# Patient Record
Sex: Male | Born: 1980 | Race: White | Hispanic: No | Marital: Married | Smoking: Current every day smoker
Health system: Southern US, Community
[De-identification: ages and names within clinical notes are randomized; demographics above are authoritative.]

---

## 2009-02-03 ENCOUNTER — Emergency Department (HOSPITAL_COMMUNITY): Admission: EM | Admit: 2009-02-03 | Discharge: 2009-02-03 | Payer: Self-pay | Admitting: Emergency Medicine

## 2009-03-23 ENCOUNTER — Emergency Department (HOSPITAL_COMMUNITY): Admission: EM | Admit: 2009-03-23 | Discharge: 2009-03-23 | Payer: Self-pay | Admitting: Emergency Medicine

## 2009-09-08 ENCOUNTER — Emergency Department (HOSPITAL_COMMUNITY): Admission: EM | Admit: 2009-09-08 | Discharge: 2009-09-08 | Payer: Self-pay | Admitting: Emergency Medicine

## 2009-09-11 ENCOUNTER — Emergency Department (HOSPITAL_COMMUNITY): Admission: EM | Admit: 2009-09-11 | Discharge: 2009-09-11 | Payer: Self-pay | Admitting: Emergency Medicine

## 2009-10-28 ENCOUNTER — Emergency Department (HOSPITAL_COMMUNITY): Admission: EM | Admit: 2009-10-28 | Discharge: 2009-10-28 | Payer: Self-pay | Admitting: Emergency Medicine

## 2009-11-19 ENCOUNTER — Emergency Department (HOSPITAL_COMMUNITY): Admission: EM | Admit: 2009-11-19 | Discharge: 2009-11-19 | Payer: Self-pay | Admitting: Emergency Medicine

## 2010-06-14 LAB — PROTIME-INR: INR: 1.06 (ref 0.00–1.49)

## 2010-06-14 LAB — RAPID URINE DRUG SCREEN, HOSP PERFORMED
Amphetamines: NOT DETECTED
Barbiturates: NOT DETECTED
Cocaine: POSITIVE — AB

## 2010-06-14 LAB — HEPATIC FUNCTION PANEL
Alkaline Phosphatase: 56 U/L (ref 39–117)
Total Bilirubin: 1.1 mg/dL (ref 0.3–1.2)
Total Protein: 7.4 g/dL (ref 6.0–8.3)

## 2010-06-14 LAB — POCT I-STAT, CHEM 8
Chloride: 102 mEq/L (ref 96–112)
Creatinine, Ser: 0.9 mg/dL (ref 0.4–1.5)
Glucose, Bld: 88 mg/dL (ref 70–99)

## 2010-06-14 LAB — CBC
HCT: 41.9 % (ref 39.0–52.0)
MCH: 31.1 pg (ref 26.0–34.0)
MCV: 85.7 fL (ref 78.0–100.0)
RDW: 12.4 % (ref 11.5–15.5)

## 2010-06-14 LAB — DIFFERENTIAL
Basophils Absolute: 0 10*3/uL (ref 0.0–0.1)
Basophils Relative: 0 % (ref 0–1)
Eosinophils Relative: 1 % (ref 0–5)
Lymphs Abs: 2.7 10*3/uL (ref 0.7–4.0)
Neutro Abs: 5.9 10*3/uL (ref 1.7–7.7)
Neutrophils Relative %: 62 % (ref 43–77)

## 2010-06-14 LAB — POCT CARDIAC MARKERS
CKMB, poc: <1 ng/mL — ABNORMAL LOW (ref 1.0–8.0)
CKMB, poc: <1 ng/mL — ABNORMAL LOW (ref 1.0–8.0)
Myoglobin, poc: 72.3 ng/mL (ref 12–200)
Myoglobin, poc: 88.3 ng/mL (ref 12–200)
Troponin i, poc: <0.05 ng/mL (ref 0.00–0.09)

## 2010-06-14 LAB — ETHANOL: Alcohol, Ethyl (B): <5 mg/dL (ref 0–10)

## 2010-06-17 LAB — BASIC METABOLIC PANEL
CO2: 26 mEq/L (ref 19–32)
Calcium: 9.5 mg/dL (ref 8.4–10.5)
Chloride: 102 mEq/L (ref 96–112)
Creatinine, Ser: 1.04 mg/dL (ref 0.4–1.5)
GFR calc Af Amer: >60 mL/min (ref >60)
GFR calc non Af Amer: >60 mL/min (ref >60)

## 2010-06-17 LAB — RAPID URINE DRUG SCREEN, HOSP PERFORMED
Amphetamines: NOT DETECTED
Cocaine: POSITIVE — AB
Tetrahydrocannabinol: NOT DETECTED

## 2010-06-17 LAB — DIFFERENTIAL
Basophils Absolute: 0 10*3/uL (ref 0.0–0.1)
Basophils Relative: 0 % (ref 0–1)
Monocytes Relative: 9 % (ref 3–12)
Neutro Abs: 6.2 10*3/uL (ref 1.7–7.7)
Neutrophils Relative %: 75 % (ref 43–77)

## 2010-06-17 LAB — CBC
MCV: 89.6 fL (ref 78.0–100.0)
Platelets: 189 10*3/uL (ref 150–400)
RBC: 4.79 MIL/uL (ref 4.22–5.81)
RDW: 12.8 % (ref 11.5–15.5)

## 2010-06-17 LAB — ETHANOL: Alcohol, Ethyl (B): <5 mg/dL (ref 0–10)

## 2011-09-26 ENCOUNTER — Encounter (HOSPITAL_COMMUNITY): Payer: Self-pay | Admitting: *Deleted

## 2011-09-26 ENCOUNTER — Emergency Department (HOSPITAL_COMMUNITY)
Admission: EM | Admit: 2011-09-26 | Discharge: 2011-09-26 | Disposition: A | Discharge status: Home / Self Care | Payer: Self-pay | Attending: Emergency Medicine | Admitting: Emergency Medicine

## 2011-09-26 ENCOUNTER — Emergency Department (HOSPITAL_COMMUNITY): Payer: Self-pay

## 2011-09-26 DIAGNOSIS — R079 Chest pain, unspecified: Secondary | ICD-10-CM | POA: Insufficient documentation

## 2011-09-26 DIAGNOSIS — F1410 Cocaine abuse, uncomplicated: Secondary | ICD-10-CM | POA: Insufficient documentation

## 2011-09-26 DIAGNOSIS — F1010 Alcohol abuse, uncomplicated: Secondary | ICD-10-CM | POA: Insufficient documentation

## 2011-09-26 DIAGNOSIS — F17200 Nicotine dependence, unspecified, uncomplicated: Secondary | ICD-10-CM | POA: Insufficient documentation

## 2011-09-26 LAB — POCT I-STAT, CHEM 8
BUN: 12 mg/dL (ref 6–23)
Hemoglobin: 16.7 g/dL (ref 13.0–17.0)
Potassium: 4.1 mEq/L (ref 3.5–5.1)
Sodium: 139 mEq/L (ref 135–145)
TCO2: 24 mmol/L (ref 0–100)

## 2011-09-26 MED ORDER — NICOTINE 21 MG/24HR TD PT24: 21.0000 mg | MEDICATED_PATCH | Freq: Every day | TRANSDERMAL | Status: DC

## 2011-09-26 MED ORDER — FOLIC ACID 1 MG PO TABS
1.0000 mg | ORAL_TABLET | Freq: Every day | ORAL | Status: DC
Start: 2011-09-26 — End: 2011-09-26

## 2011-09-26 MED ORDER — VITAMIN B-1 100 MG PO TABS: 100.0000 mg | ORAL_TABLET | Freq: Every day | ORAL | Status: DC

## 2011-09-26 MED ORDER — ADULT MULTIVITAMIN W/MINERALS CH: 1.0000 | ORAL_TABLET | Freq: Every day | ORAL | Status: DC

## 2011-09-26 MED ORDER — THIAMINE HCL 100 MG/ML IJ SOLN: 100.0000 mg | Freq: Every day | INTRAMUSCULAR | Status: DC

## 2011-09-26 MED ORDER — ZOLPIDEM TARTRATE 5 MG PO TABS: 5.0000 mg | ORAL_TABLET | Freq: Every evening | ORAL | Status: DC | PRN

## 2011-09-26 MED ORDER — LORAZEPAM 1 MG PO TABS: 1.0000 mg | ORAL_TABLET | Freq: Four times a day (QID) | ORAL | Status: DC | PRN

## 2011-09-26 MED ORDER — ACETAMINOPHEN 325 MG PO TABS
975.0000 mg | ORAL_TABLET | Freq: Once | ORAL | Status: AC
  Administered 2011-09-26: 975 mg via ORAL
  Filled 2011-09-26: qty 3

## 2011-09-26 MED ORDER — LORAZEPAM 1 MG PO TABS: 0.0000 mg | ORAL_TABLET | Freq: Two times a day (BID) | ORAL | Status: DC

## 2011-09-26 MED ORDER — ALUM & MAG HYDROXIDE-SIMETH 200-200-20 MG/5ML PO SUSP: 30.0000 mL | ORAL | Status: DC | PRN

## 2011-09-26 MED ORDER — LORAZEPAM 1 MG PO TABS: 1.0000 mg | ORAL_TABLET | Freq: Three times a day (TID) | ORAL | Status: DC | PRN

## 2011-09-26 MED ORDER — ONDANSETRON HCL 4 MG PO TABS: 4.0000 mg | ORAL_TABLET | Freq: Three times a day (TID) | ORAL | Status: DC | PRN

## 2011-09-26 MED ORDER — LORAZEPAM 1 MG PO TABS: 0.0000 mg | ORAL_TABLET | Freq: Four times a day (QID) | ORAL | Status: DC

## 2011-09-26 MED ORDER — LORAZEPAM 2 MG/ML IJ SOLN: 1.0000 mg | Freq: Four times a day (QID) | INTRAMUSCULAR | Status: DC | PRN

## 2011-09-26 NOTE — ED Notes (Signed)
Per EMS: Pt reports SOB, chest "tightness" diaphoresis and headache. States he has hx of etoh and cocaine abuse, last used last night. Pt states he woke up with chest tightness and SOB, called EMS. Pt seeking detox treatment

## 2011-09-26 NOTE — Discharge Planning (Signed)
CSW provided patient with list of SA resources. No further needs at this time. Patient denied SI/HI/AVH.  Manson Passey Jenipher Havel ANN S , MSW, LCSWA 09/26/2011 9:20 AM [phone]

## 2011-09-26 NOTE — ED Provider Notes (Signed)
History     CSN: [id number]  Arrival date & time 09/26/11  4098   First MD Initiated Contact with Patient 09/26/11 [phone]      Chief Complaint  Patient presents with  . Chest Pain  . Shortness of Breath    (Consider location/radiation/quality/duration/timing/severity/associated sxs/prior treatment) HPI Comments: Patient comes in today with a chief complaint of chest pain and SOB.  He reports that the chest pain began after using cocaine last evening.  He reports that he also drank a large amount of alcohol last evening. He was given oxygen by EMS en route to the ED.   He denies any chest pain or SOB at this time.  He is complaining of a bitemporal headache.  He reports that he uses cocaine once every 2 months.  He reports that he always becomes short of breath and has chest pain after using cocaine.  He is requesting outpatient help for cocaine and alcohol abuse.    Patient is a 31 y.o. male presenting with chest pain. The history is provided by the patient.  Chest Pain The chest pain is resolved. The pain is associated with drug use. The quality of the pain is described as pressure-like. The pain does not radiate. Primary symptoms include shortness of breath. Pertinent negatives for primary symptoms include no fever, no syncope, no cough, no wheezing, no palpitations, no abdominal pain, no nausea, no vomiting and no dizziness.  Pertinent negatives for associated symptoms include no diaphoresis, no lower extremity edema, no near-syncope and no numbness.     History reviewed. No pertinent past medical history.  History reviewed. No pertinent past surgical history.  No family history on file.  History  Substance Use Topics  . Smoking status: Current Everyday Smoker -- 2.0 packs/day    Types: Cigarettes  . Smokeless tobacco: Not on file  . Alcohol Use: Yes     social      Review of Systems  Constitutional: Negative for fever, chills and diaphoresis.  HENT: Negative for neck  pain and neck stiffness.   Respiratory: Positive for shortness of breath. Negative for cough and wheezing.   Cardiovascular: Positive for chest pain. Negative for palpitations, syncope and near-syncope.  Gastrointestinal: Negative for nausea, vomiting and abdominal pain.  Skin: Negative for rash.  Neurological: Positive for headaches. Negative for dizziness, syncope, light-headedness and numbness.  All other systems reviewed and are negative.    Allergies  Review of patient's allergies indicates no known allergies.  Home Medications  No current outpatient prescriptions on file.  BP 125/94  Pulse 88  Temp 98.6 F (37 C) (Oral)  Resp 14  SpO2 100%  Physical Exam  Nursing note and vitals reviewed. Constitutional: He appears well-developed and well-nourished. No distress.  HENT:  Head: Normocephalic and atraumatic.  Mouth/Throat: Oropharynx is clear and moist.  Eyes: EOM are normal. Pupils are equal, round, and reactive to light.  Neck: Normal range of motion. Neck supple.  Cardiovascular: Normal rate, regular rhythm, normal heart sounds and intact distal pulses.   Pulmonary/Chest: Effort normal and breath sounds normal. No respiratory distress. He has no wheezes. He has no rales. He exhibits no tenderness.  Abdominal: Soft. Bowel sounds are normal. He exhibits no distension. There is no tenderness.  Musculoskeletal: Normal range of motion.  Neurological: He is alert.  Skin: Skin is warm and dry. No rash noted. He is not diaphoretic.  Psychiatric: He has a normal mood and affect.    ED Course  Procedures (including  critical care time)  Labs Reviewed  POCT I-STAT, CHEM 8 - Abnormal; Notable for the following:    Glucose, Bld 105 (*)     All other components within normal limits   Dg Chest 2 View  09/26/2011  *RADIOLOGY REPORT*  Clinical Data: Short of breath  CHEST - 2 VIEW  Comparison:  11/19/2009  Findings:  The heart size and mediastinal contours are within normal  limits.  Both lungs are clear.  The visualized skeletal structures are unremarkable.  IMPRESSION: No active cardiopulmonary disease.  Original Report Authenticated By: Camelia Phenes, M.D.     1. Cocaine abuse   2. Alcohol abuse   3. Chest pain       MDM  Patient presenting with Chest pain and SOB after cocaine use.  Symptoms had resolved by the time I did my evaluation in the ED.  VSS.  Labs unremarkable.  No acute changes on EKG.  Negative CXR.  Therefore, feel that patient can be discharged home.  Patient seen by ACT team and given outpatient referrals for substance abuse.        Pascal Lux [place], PA-C 09/26/11 1624

## 2011-09-26 NOTE — ED Notes (Signed)
Pt denies hx of etoh or cocaine abuse - states he used last night, states he is a social drinker and uses "once every few months". Denies SI/HI

## 2011-09-26 NOTE — ED Notes (Signed)
Pt. Was given blue scrubs and red socks. Clothing was placed in belonging bag.

## 2011-09-26 NOTE — ED Provider Notes (Signed)
Complained of bilateral pleuritic chest pain onset this morning after use cocaine all day yesterday. Also complains of mild headache bitemporal gradual onset yesterday. Patient has been on a drinking binge since 09/24/2011. He reports a drinking alcohol weeks to cocaine use. Brought by EMS treated with oxygen en route. He is presently asymptomatic except for mild bitemporal headache on exam he is alert Glasgow Coma Score 15 does not appear intoxicated HEENT exam no facial asymmetry neck supple trachea midline lungs clear auscultation heart regular rate and rhythm abdomen nondistended nontender extremities without edema neurologic Glasgow Coma Score 15 cranial nerves II through XII grossly intact moves all extremities well.  Date: 09/26/2011  Rate: 95  Rhythm: normal sinus rhythm  QRS Axis: normal  Intervals: normal  ST/T Wave abnormalities: normal  Conduction Disutrbances:nonspecific intraventricular conduction delay  Narrative Interpretation:   Old EKG Reviewed: unchanged Unchanged from 11/08/09, interpreted by me Patient does not appear intoxicated and does not appear to be in withdrawal from drugs or alcohol. He is amenable to outpatient referrals for substance abuse,, which will be provided byACT Keith Rake, MD 09/26/11 [phone]

## 2011-09-26 NOTE — Discharge Instructions (Signed)
Use the resource guide that you were given in the Emergency Department to find help with alcohol and cocaine rehabilitation.  Read instructions below for reasons to return to the Emergency Department. It is recommended that your follow up with your Primary Care Doctor in regards to today's visit. If you do not have a doctor, use the resource guide listed below to help you find one.  Chest Pain (Nonspecific)  HOME CARE INSTRUCTIONS  For the next few days, avoid physical activities that bring on chest pain. Continue physical activities as directed.  Do not smoke cigarettes or drink alcohol until your symptoms are gone.  Only take over-the-counter or prescription medicine for pain, discomfort, or fever as directed by your caregiver.  Follow your caregiver's suggestions for further testing if your chest pain does not go away.  Keep any follow-up appointments you made. If you do not go to an appointment, you could develop lasting (chronic) problems with pain. If there is any problem keeping an appointment, you must call to reschedule.  SEEK MEDICAL CARE IF:  You think you are having problems from the medicine you are taking. Read your medicine instructions carefully.  Your chest pain does not go away, even after treatment.  You develop a rash with blisters on your chest.  SEEK IMMEDIATE MEDICAL CARE IF:  You have increased chest pain or pain that spreads to your arm, neck, jaw, back, or belly (abdomen).  You develop shortness of breath, an increasing cough, or you are coughing up blood.  You have severe back or abdominal pain, feel sick to your stomach (nauseous) or throw up (vomit).  You develop severe weakness, fainting, or chills.  You have an oral temperature above 102 F (38.9 C), not controlled by medicine.   THIS IS AN EMERGENCY. Do not wait to see if the pain will go away. Get medical help at once. Call your local emergency services (911 in [place] Do not drive yourself to the  hospital.   RESOURCE GUIDE  Dental Problems  Patients with Medicaid: [facility] [phone] [address]                                           [phone] [address] Phone:  [phone]                                                  Phone:  [phone]  If unable to pay or uninsured, contact:  [facility] or [facility]. to become qualified for the adult dental clinic.  Chronic Pain Problems Contact [facility]  [phone] Patients need to be referred by their primary care doctor.  Insufficient Money for Medicine Contact [employer]:  call "211" or [facility] [phone])[phone].  No Primary Care Doctor Call [facility]  [phone] Other agencies that provide inexpensive medical care    [facility]  [phone]    [facility]  [phone]    [facility]  [phone]    [facility]  [phone]    [facility]  [phone]  [facility]  8190372605  Psychological Services [facility]  [phone] [facility]  [phone] [facility]   [phone] (emergency services 7326789606)  Substance Abuse Resources [facility]  [phone] [facility] [phone]) [phone] [facility] [phone] Floyd[facility] Flock (959)324-9751 Residential & Outpatient Substance Abuse Program  [phone]  Abuse/Neglect [facility] Child Abuse Hotline (614) 798-7222 [facility] Child Abuse Hotline 619 209 5413 (After Hours)  Emergency Shelter [facility] [phone]  [facility] [phone] [facility] [phone]  MRSA Hotline #:   617-091-4207    [facility] Resources  [facility]     [employer]                          [facility] Dept. [address] [place]                       [address]      [address]  Blondell Reveal Phone:  [phone]                                   Phone:  [phone]                 Phone:  [phone]  [facility] Phone:  [phone]  [facility] Child Abuse Hotline 561-850-7998 [phone] (After Hours)

## 2011-09-26 NOTE — ED Notes (Signed)
[id number]<AV> Expected date:<BR> Expected time:<BR> Means of arrival:<BR> Comments:<BR> Detox/med clearance

## 2011-09-26 NOTE — ED Notes (Signed)
PA at bedside.

## 2011-09-26 NOTE — ED Provider Notes (Signed)
Medical screening examination/treatment/procedure(s) were conducted as a shared visit with non-physician practitioner(s) and myself.  I personally evaluated the patient during the encounter  Doug Sou, MD 09/26/11 [phone]

## 2012-09-21 ENCOUNTER — Emergency Department (HOSPITAL_COMMUNITY)
Admission: EM | Admit: 2012-09-21 | Discharge: 2012-09-21 | Disposition: A | Discharge status: Home / Self Care | Payer: Self-pay | Attending: Emergency Medicine | Admitting: Emergency Medicine

## 2012-09-21 ENCOUNTER — Encounter (HOSPITAL_COMMUNITY): Payer: Self-pay

## 2012-09-21 ENCOUNTER — Emergency Department (HOSPITAL_COMMUNITY): Payer: Self-pay

## 2012-09-21 DIAGNOSIS — Y9389 Activity, other specified: Secondary | ICD-10-CM | POA: Insufficient documentation

## 2012-09-21 DIAGNOSIS — X503XXA Overexertion from repetitive movements, initial encounter: Secondary | ICD-10-CM | POA: Insufficient documentation

## 2012-09-21 DIAGNOSIS — S93409A Sprain of unspecified ligament of unspecified ankle, initial encounter: Secondary | ICD-10-CM | POA: Insufficient documentation

## 2012-09-21 DIAGNOSIS — S93401A Sprain of unspecified ligament of right ankle, initial encounter: Secondary | ICD-10-CM

## 2012-09-21 DIAGNOSIS — Y929 Unspecified place or not applicable: Secondary | ICD-10-CM | POA: Insufficient documentation

## 2012-09-21 DIAGNOSIS — F17200 Nicotine dependence, unspecified, uncomplicated: Secondary | ICD-10-CM | POA: Insufficient documentation

## 2012-09-21 MED ORDER — OXYCODONE-ACETAMINOPHEN 5-325 MG PO TABS
1.0000 | ORAL_TABLET | Freq: Once | ORAL | Status: AC
  Administered 2012-09-21: 1 via ORAL
  Filled 2012-09-21: qty 1

## 2012-09-21 MED ORDER — OXYCODONE-ACETAMINOPHEN 5-325 MG PO TABS: 1.0000 | ORAL_TABLET | ORAL | Status: DC | PRN

## 2012-09-21 MED ORDER — IBUPROFEN 800 MG PO TABS
800.0000 mg | ORAL_TABLET | Freq: Once | ORAL | Status: AC
  Administered 2012-09-21: 800 mg via ORAL
  Filled 2012-09-21: qty 1

## 2012-09-21 MED ORDER — IBUPROFEN 800 MG PO TABS: 800.0000 mg | ORAL_TABLET | Freq: Three times a day (TID) | ORAL | Status: DC | PRN

## 2012-09-21 NOTE — ED Notes (Signed)
Pt reports stepping wrong early this am, reports R ankle pain.  No deformity noted.

## 2012-09-21 NOTE — Progress Notes (Signed)
[facility] has seen patient and provided him with a list of primary care resources. °

## 2012-09-21 NOTE — ED Notes (Signed)
Per EMS pt c/o stepping wrong, c/o rt ankle pain and swelling since last night, no deformity noted

## 2012-09-21 NOTE — ED Provider Notes (Signed)
   History    CSN: [id number] Arrival date & time 09/21/12  0714  First MD Initiated Contact with Patient 09/21/12 [phone]     Chief Complaint  Patient presents with  . Ankle Pain   (Consider location/radiation/quality/duration/timing/severity/associated sxs/prior Treatment) Patient is a 32 y.o. male presenting with ankle pain. The history is provided by the patient. No language interpreter was used.  Ankle Pain Location:  Ankle Ankle location:  R ankle Associated symptoms: no fever   Associated symptoms comment:  Painful, swollen ankle since twisting injury last night. No other injury.  History reviewed. No pertinent past medical history. No past surgical history on file. No family history on file. History  Substance Use Topics  . Smoking status: Current Every Day Smoker -- 2.00 packs/day    Types: Cigarettes  . Smokeless tobacco: Not on file  . Alcohol Use: Yes     Comment: social    Review of Systems  Constitutional: Negative for fever and chills.  Musculoskeletal:       See HPI  Skin: Negative.   Neurological: Negative.  Negative for numbness.    Allergies  Review of patient's allergies indicates no known allergies.  Home Medications  No current outpatient prescriptions on file. BP 114/61  Pulse 109  Temp(Src) 99 F (37.2 C) (Oral)  Resp 20  SpO2 97% Physical Exam  Constitutional: He is oriented to person, place, and time. He appears well-developed and well-nourished. No distress.  Cardiovascular: Intact distal pulses.   Musculoskeletal:  Right ankle swollen laterally with minimal ecchymosis. Joint stable. No bony deformity.  Neurological: He is alert and oriented to person, place, and time.    ED Course  Procedures (including critical care time) Labs Reviewed - No data to display Dg Ankle Complete Right  09/21/2012   *RADIOLOGY REPORT*  Clinical Data: Twisted ankle with pain laterally  RIGHT ANKLE - COMPLETE 3+ VIEW  Comparison: None.  Findings: The  ankle joint appears normal.  Alignment is normal.  No acute fracture is seen.  A linear lucency on the lateral view overlying the distal tibia does not appear to represent fracture on the other views.  IMPRESSION: No acute fracture.   Original Report Authenticated By: Dwyane Dee, M.D.   No diagnosis found. 1. Right ankle sprain MDM  Uncomplicated ankle sprain. Negative x-ray.  Arnoldo Hooker, PA-C 09/21/12 0840

## 2012-09-21 NOTE — ED Notes (Signed)
[facility] ortho tech contacted to place pt on ASO and crutches.

## 2012-09-22 NOTE — ED Provider Notes (Signed)
Medical screening examination/treatment/procedure(s) were performed by non-physician practitioner and as supervising physician I was immediately available for consultation/collaboration.    Edem Tiegs D Amaani Guilbault, MD 09/22/12 1739 

## 2012-09-26 ENCOUNTER — Encounter (HOSPITAL_COMMUNITY): Payer: Self-pay | Admitting: Emergency Medicine

## 2012-09-26 ENCOUNTER — Emergency Department (HOSPITAL_COMMUNITY)
Admission: EM | Admit: 2012-09-26 | Discharge: 2012-09-26 | Disposition: A | Discharge status: Home / Self Care | Payer: Self-pay | Attending: Emergency Medicine | Admitting: Emergency Medicine

## 2012-09-26 DIAGNOSIS — IMO0002 Reserved for concepts with insufficient information to code with codable children: Secondary | ICD-10-CM | POA: Insufficient documentation

## 2012-09-26 DIAGNOSIS — F17200 Nicotine dependence, unspecified, uncomplicated: Secondary | ICD-10-CM | POA: Insufficient documentation

## 2012-09-26 DIAGNOSIS — L0291 Cutaneous abscess, unspecified: Secondary | ICD-10-CM

## 2012-09-26 DIAGNOSIS — B653 Cercarial dermatitis: Secondary | ICD-10-CM | POA: Insufficient documentation

## 2012-09-26 DIAGNOSIS — R21 Rash and other nonspecific skin eruption: Secondary | ICD-10-CM | POA: Insufficient documentation

## 2012-09-26 MED ORDER — LIDOCAINE-EPINEPHRINE 2 %-1:100000 IJ SOLN
10.0000 mL | Freq: Once | INTRAMUSCULAR | Status: DC
  Filled 2012-09-26: qty 10

## 2012-09-26 MED ORDER — SULFAMETHOXAZOLE-TRIMETHOPRIM 800-160 MG PO TABS: 1.0000 | ORAL_TABLET | Freq: Two times a day (BID) | ORAL | Status: DC

## 2012-09-26 MED ORDER — IBUPROFEN 400 MG PO TABS
600.0000 mg | ORAL_TABLET | Freq: Once | ORAL | Status: AC
  Administered 2012-09-26: 600 mg via ORAL
  Filled 2012-09-26: qty 3

## 2012-09-26 MED ORDER — SULFAMETHOXAZOLE-TMP DS 800-160 MG PO TABS
1.0000 | ORAL_TABLET | Freq: Once | ORAL | Status: AC
  Administered 2012-09-26: 1 via ORAL
  Filled 2012-09-26: qty 1

## 2012-09-26 NOTE — ED Notes (Signed)
Wound care done, bandages applied to both abscess
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
## 2012-10-26 ENCOUNTER — Emergency Department (HOSPITAL_COMMUNITY)
Admission: EM | Admit: 2012-10-26 | Discharge: 2012-10-26 | Discharge status: Against Medical Advice | Payer: Self-pay | Attending: Emergency Medicine | Admitting: Emergency Medicine

## 2012-10-26 ENCOUNTER — Encounter (HOSPITAL_COMMUNITY): Payer: Self-pay | Admitting: Emergency Medicine

## 2012-10-26 ENCOUNTER — Emergency Department (HOSPITAL_COMMUNITY): Payer: Self-pay

## 2012-10-26 DIAGNOSIS — X58XXXA Exposure to other specified factors, initial encounter: Secondary | ICD-10-CM | POA: Insufficient documentation

## 2012-10-26 DIAGNOSIS — S8990XA Unspecified injury of unspecified lower leg, initial encounter: Secondary | ICD-10-CM | POA: Insufficient documentation

## 2012-10-26 DIAGNOSIS — Y939 Activity, unspecified: Secondary | ICD-10-CM | POA: Insufficient documentation

## 2012-10-26 DIAGNOSIS — S99919A Unspecified injury of unspecified ankle, initial encounter: Secondary | ICD-10-CM

## 2012-10-26 DIAGNOSIS — Y929 Unspecified place or not applicable: Secondary | ICD-10-CM | POA: Insufficient documentation

## 2012-10-26 MED ORDER — IBUPROFEN 400 MG PO TABS: 800.0000 mg | ORAL_TABLET | Freq: Once | ORAL | Status: DC

## 2012-10-26 NOTE — ED Notes (Signed)
Pt at desk stating he was ready for his paperwork, states he does not want his ankle brace and that he already has one, states he is just ready to leave and he doesn't want anything else

## 2012-10-26 NOTE — ED Notes (Signed)
Pt states he cannot wait for his paperwork and left before being discharged
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
## 2012-10-31 NOTE — ED Provider Notes (Signed)
Never saw the patinet left before examination   Arman Filter, NP 10/31/12 1953  Arman Filter, NP 10/31/12 1954

## 2012-11-02 NOTE — ED Provider Notes (Signed)
Medical screening examination/treatment/procedure(s) were performed by non-physician practitioner and as supervising physician I was immediately available for consultation/collaboration.   Gianelle Mccaul, MD 11/02/12 1659 

## 2013-02-10 ENCOUNTER — Emergency Department (HOSPITAL_COMMUNITY)
Admission: EM | Admit: 2013-02-10 | Discharge: 2013-02-10 | Disposition: A | Discharge status: Home / Self Care | Payer: Self-pay | Attending: Emergency Medicine | Admitting: Emergency Medicine

## 2013-02-10 ENCOUNTER — Encounter (HOSPITAL_COMMUNITY): Payer: Self-pay | Admitting: Emergency Medicine

## 2013-02-10 DIAGNOSIS — H1189 Other specified disorders of conjunctiva: Secondary | ICD-10-CM | POA: Insufficient documentation

## 2013-02-10 DIAGNOSIS — F17200 Nicotine dependence, unspecified, uncomplicated: Secondary | ICD-10-CM | POA: Insufficient documentation

## 2013-02-10 DIAGNOSIS — H11431 Conjunctival hyperemia, right eye: Secondary | ICD-10-CM

## 2013-02-10 DIAGNOSIS — H579 Unspecified disorder of eye and adnexa: Secondary | ICD-10-CM

## 2013-02-10 MED ORDER — TETRACAINE HCL 0.5 % OP SOLN
1.0000 [drp] | Freq: Once | OPHTHALMIC | Status: DC
  Filled 2013-02-10: qty 2

## 2013-02-10 MED ORDER — ERYTHROMYCIN 5 MG/GM OP OINT: 1.0000 "application " | TOPICAL_OINTMENT | Freq: Four times a day (QID) | OPHTHALMIC | Status: DC

## 2013-02-10 MED ORDER — FLUORESCEIN SODIUM 1 MG OP STRP
1.0000 | ORAL_STRIP | Freq: Once | OPHTHALMIC | Status: DC
  Filled 2013-02-10: qty 1

## 2013-02-10 NOTE — ED Provider Notes (Signed)
CSN: [id number]     Arrival date & time 02/10/13  2107 History   First MD Initiated Contact with Patient 02/10/13 2131     This chart was scribed for Antony Madura, by Ladona Ridgel Day, ED scribe. This patient was seen in room WTR8/WTR8 and the patient's care was started at 2131.  Chief Complaint  Patient presents with  . Eye Pain   The history is provided by the patient. No language interpreter was used.   HPI Comments: Benjamin Chandler is a 32 y.o. male who presents to the Emergency Department complaining of foreign body sensation to his right eye with associated pain/redness which he attributes to possible ceramic fragment which broke when he was tearing ceramic tiles out of a floor, onset 2 days ago. He states tried flushing his eye w/water which did not seem to help. He denies any vision loss. He states a mild HA which he attributes to his eye pain.   History reviewed. No pertinent past medical history. History reviewed. No pertinent past surgical history. History reviewed. No pertinent family history. History  Substance Use Topics  . Smoking status: Current Every Day Smoker -- 2.00 packs/day    Types: Cigarettes  . Smokeless tobacco: Not on file  . Alcohol Use: Yes     Comment: social    Review of Systems  Constitutional: Negative for fever and chills.  Eyes: Positive for pain (right eye) and redness. Negative for photophobia and visual disturbance.  Respiratory: Negative for shortness of breath.   Gastrointestinal: Negative for nausea and vomiting.  Neurological: Negative for weakness.  All other systems reviewed and are negative.  A complete 10 system review of systems was obtained and all systems are negative except as noted in the HPI and PMH.   Allergies  Review of patient's allergies indicates no known allergies.  Home Medications   Current Outpatient Rx  Name  Route  Sig  Dispense  Refill  . erythromycin ophthalmic ointment   Right Eye   Place 1 application into the  right eye every 6 (six) hours. Place 1/2 inch ribbon of ointment in the affected eye 4 times a day   1 g   1    Triage Vitals: BP 127/81  Pulse 76  Temp(Src) 98.4 F (36.9 C) (Oral)  Resp 18  Ht 6\' 2"  (1.88 m)  Wt 205 lb (92.987 kg)  BMI 26.31 kg/m2  SpO2 97%  Physical Exam  Nursing note and vitals reviewed. Constitutional: He is oriented to person, place, and time. He appears well-developed and well-nourished. No distress.  HENT:  Head: Normocephalic and atraumatic.  Right Ear: External ear normal.  Left Ear: External ear normal.  Mouth/Throat: Oropharynx is clear and moist. No oropharyngeal exudate.  Eyes: EOM are normal. Pupils are equal, round, and reactive to light. Right eye exhibits no discharge. Left eye exhibits no discharge.  Conjunctival injection R eye, without conjunctival hemorrhage. Left eye visual acuity 20/25; Right eye 20/40. No corneal abrasion or ulcer. Nor fluorescein uptake. IOP right eye 15 with 95% CI. No pain with EOMs. No foreign bodies appreciated in R eye.  Neck: Normal range of motion. Neck supple. No tracheal deviation present.  Cardiovascular: Normal rate.   Pulmonary/Chest: Effort normal. No respiratory distress.  Musculoskeletal: Normal range of motion.  Neurological: He is alert and oriented to person, place, and time.  Skin: Skin is warm and dry.  Psychiatric: He has a normal mood and affect. His behavior is normal.    ED  Course  Procedures (including critical care time) DIAGNOSTIC STUDIES: Oxygen Saturation is 97% on room air, normal by my interpretation.    COORDINATION OF CARE: At 940 PM Discussed treatment plan with patient which includes tetracaine, fluorescein, visual acuity screen. Patient agrees.   Labs Review Labs Reviewed - No data to display Imaging Review No results found.  EKG Interpretation   None       MDM   1. Conjunctival injection, right   2. Sensation of foreign body in eye    Patient presents for FB  sensation in R eye x 2 days. Patient PERRL with conjunctival injection on R. Mild change in visual acuity; 20/40 OD and 20/25 OS. No fluorescin uptake to suggest corneal ulcer or abrasion. Normal IOP in R eye. No FB visualized, therefore will refer patient to ophthalmology for further evaluation. Will prescribe erythromycin ointment to cover for infection and patient provided ophthalmology referral. Return precautions discussed and patient agreeable to plan with no unaddressed concerns.   I personally performed the services described in this documentation, which was scribed in my presence. The recorded information has been reviewed and is accurate.      Antony Madura, PA-C 02/18/13 2031

## 2013-02-10 NOTE — ED Notes (Signed)
Pt feels like he has something in his right eye, possibly a piece of ceramic tile

## 2013-02-21 NOTE — ED Provider Notes (Signed)
  Medical screening examination/treatment/procedure(s) were performed by non-physician practitioner and as supervising physician I was immediately available for consultation/collaboration.      Quiara Killian, MD 02/21/13 1043 

## 2013-12-05 ENCOUNTER — Emergency Department (HOSPITAL_COMMUNITY)
Admission: EM | Admit: 2013-12-05 | Discharge: 2013-12-05 | Disposition: A | Discharge status: Home / Self Care | Payer: Self-pay | Attending: Emergency Medicine | Admitting: Emergency Medicine

## 2013-12-05 ENCOUNTER — Encounter (HOSPITAL_COMMUNITY): Payer: Self-pay | Admitting: Emergency Medicine

## 2013-12-05 DIAGNOSIS — F17200 Nicotine dependence, unspecified, uncomplicated: Secondary | ICD-10-CM | POA: Insufficient documentation

## 2013-12-05 DIAGNOSIS — M542 Cervicalgia: Secondary | ICD-10-CM | POA: Insufficient documentation

## 2013-12-05 DIAGNOSIS — M436 Torticollis: Secondary | ICD-10-CM | POA: Insufficient documentation

## 2013-12-05 DIAGNOSIS — R209 Unspecified disturbances of skin sensation: Secondary | ICD-10-CM | POA: Insufficient documentation

## 2013-12-05 MED ORDER — IBUPROFEN 600 MG PO TABS: 600.0000 mg | ORAL_TABLET | Freq: Three times a day (TID) | ORAL | Status: DC

## 2013-12-05 MED ORDER — DIAZEPAM 5 MG PO TABS
5.0000 mg | ORAL_TABLET | Freq: Once | ORAL | Status: AC
  Administered 2013-12-05: 5 mg via ORAL
  Filled 2013-12-05: qty 1

## 2013-12-05 MED ORDER — DIAZEPAM 5 MG PO TABS: 5.0000 mg | ORAL_TABLET | Freq: Three times a day (TID) | ORAL | Status: DC | PRN

## 2013-12-05 MED ORDER — IBUPROFEN 600 MG PO TABS: 600.0000 mg | ORAL_TABLET | Freq: Four times a day (QID) | ORAL | Status: DC | PRN

## 2013-12-05 MED ORDER — IBUPROFEN 200 MG PO TABS
600.0000 mg | ORAL_TABLET | Freq: Once | ORAL | Status: AC
  Administered 2013-12-05: 600 mg via ORAL
  Filled 2013-12-05: qty 3

## 2013-12-05 NOTE — ED Notes (Addendum)
Per patient-having mid-posterior neck pain radiating to right arm and hand causing "numbness and tingling." Symptomatic x1 week. No cardiac hx or familial cardiac hx. Hasn't taken any medications. Works for [employer] (has entire working life) and does consistent heavy lifting. Denies back surgery and denies neck injury. Has full ROM regarding neck.  In NAD. Awaiting MD/PA.

## 2013-12-05 NOTE — ED Provider Notes (Signed)
CSN: [id number]     Arrival date & time 12/05/13  1924 History   None    Chief Complaint  Patient presents with  . Neck Pain  . Numbness    right arm   Patient is a 33 y.o. male presenting with neck pain. The history is provided by the patient. No language interpreter was used.  Neck Pain Pain location:  Generalized neck Quality:  Aching Pain radiates to:  Does not radiate Pain severity:  Moderate Pain is:  Same all the time Timing:  Constant Progression:  Unchanged Chronicity:  New Context comment:  Woke from sleep Relieved by:  Nothing Worsened by:  Position Ineffective treatments:  NSAIDs Associated symptoms: paresis   Associated symptoms: no headaches    This chart was scribed for nurse practitioner Earley Favor, NP working with Mirian Mo, MD, by Andrew Au, ED Scribe. This patient was seen in room WTR9/WTR9 and the patient's care was started at 8:19 PM.  Benjamin Chandler is a 33 y.o. male who presents to the Emergency Department complaining of radiating posterior neck pain and stiffness that began 1 week ago upon waking up. Pt reports pain radiates down right arm with associated tingling to right hand. Pt reports worsening pain with movement. Pt has relief with holding his head down. Pt has been taking ibuprofen and using icy hot. Pt denies injuries.  History reviewed. No pertinent past medical history. History reviewed. No pertinent past surgical history. History reviewed. No pertinent family history. History  Substance Use Topics  . Smoking status: Current Every Day Smoker -- 1.00 packs/day    Types: Cigarettes  . Smokeless tobacco: Not on file  . Alcohol Use: Yes     Comment: social    Review of Systems  Musculoskeletal: Positive for neck pain and neck stiffness.  Neurological: Negative for dizziness and headaches.   Allergies  Review of patient's allergies indicates no known allergies.  Home Medications   Prior to Admission medications   Medication Sig  Start Date End Date Taking? Authorizing Provider  ibuprofen (ADVIL,MOTRIN) 200 MG tablet Take 400 mg by mouth once as needed for moderate pain.   Yes Historical Provider, MD  diazepam (VALIUM) 5 MG tablet Take 1 tablet (5 mg total) by mouth every 8 (eight) hours as needed for anxiety. 12/05/13   Arman Filter, NP  ibuprofen (ADVIL,MOTRIN) 600 MG tablet Take 1 tablet (600 mg total) by mouth 3 (three) times daily. 12/05/13   Arman Filter, NP   BP 120/75  Pulse 99  Temp(Src) 98.4 F (36.9 C) (Oral)  Resp 25  SpO2 100% Physical Exam  Nursing note and vitals reviewed. Constitutional: He is oriented to person, place, and time. He appears well-developed and well-nourished. No distress.  HENT:  Head: Normocephalic and atraumatic.  Eyes: Conjunctivae and EOM are normal.  Neck: Muscular tenderness present. No spinous process tenderness present.  Cardiovascular: Normal rate.   Pulmonary/Chest: Effort normal.  Musculoskeletal: Normal range of motion.  Neurological: He is alert and oriented to person, place, and time.  Skin: Skin is warm and dry.  Psychiatric: He has a normal mood and affect. His behavior is normal.    ED Course  Procedures (including critical care time) Labs Review Labs Reviewed - No data to display  Imaging Review No results found.   EKG Interpretation None      MDM   Final diagnoses:  Torticollis  Pateint sleeps with a air condition vent over the bed   I personally  performed the services described in this documentation, which was scribed in my presence. The recorded information has been reviewed and is accurate.      Arman Filter, NP 12/05/13 2048

## 2013-12-05 NOTE — ED Notes (Signed)
Pt knows not to drink alcohol/drive/operate heavy machinery with prescribed medications. Heat applications given. No other questions/concerns.

## 2013-12-05 NOTE — ED Provider Notes (Signed)
Medical screening examination/treatment/procedure(s) were performed by non-physician practitioner and as supervising physician I was immediately available for consultation/collaboration.   EKG Interpretation None        Mai Longnecker, MD 12/05/13 2348 

## 2014-05-07 ENCOUNTER — Emergency Department (HOSPITAL_COMMUNITY)
Admission: EM | Admit: 2014-05-07 | Discharge: 2014-05-07 | Disposition: A | Discharge status: Home / Self Care | Payer: Self-pay | Attending: Emergency Medicine | Admitting: Emergency Medicine

## 2014-05-07 ENCOUNTER — Encounter (HOSPITAL_COMMUNITY): Payer: Self-pay | Admitting: *Deleted

## 2014-05-07 ENCOUNTER — Emergency Department (HOSPITAL_COMMUNITY): Payer: Self-pay

## 2014-05-07 DIAGNOSIS — Z720 Tobacco use: Secondary | ICD-10-CM | POA: Insufficient documentation

## 2014-05-07 DIAGNOSIS — Z23 Encounter for immunization: Secondary | ICD-10-CM | POA: Insufficient documentation

## 2014-05-07 DIAGNOSIS — Y998 Other external cause status: Secondary | ICD-10-CM | POA: Insufficient documentation

## 2014-05-07 DIAGNOSIS — Y9389 Activity, other specified: Secondary | ICD-10-CM | POA: Insufficient documentation

## 2014-05-07 DIAGNOSIS — W270XXA Contact with workbench tool, initial encounter: Secondary | ICD-10-CM | POA: Insufficient documentation

## 2014-05-07 DIAGNOSIS — S61211A Laceration without foreign body of left index finger without damage to nail, initial encounter: Secondary | ICD-10-CM | POA: Insufficient documentation

## 2014-05-07 DIAGNOSIS — S61219A Laceration without foreign body of unspecified finger without damage to nail, initial encounter: Secondary | ICD-10-CM

## 2014-05-07 DIAGNOSIS — IMO0002 Reserved for concepts with insufficient information to code with codable children: Secondary | ICD-10-CM

## 2014-05-07 DIAGNOSIS — Z791 Long term (current) use of non-steroidal anti-inflammatories (NSAID): Secondary | ICD-10-CM | POA: Insufficient documentation

## 2014-05-07 DIAGNOSIS — Y9289 Other specified places as the place of occurrence of the external cause: Secondary | ICD-10-CM | POA: Insufficient documentation

## 2014-05-07 MED ORDER — CEPHALEXIN 500 MG PO CAPS: 500.0000 mg | ORAL_CAPSULE | Freq: Four times a day (QID) | ORAL | Status: DC

## 2014-05-07 MED ORDER — SULFAMETHOXAZOLE-TRIMETHOPRIM 800-160 MG PO TABS: 1.0000 | ORAL_TABLET | Freq: Two times a day (BID) | ORAL | Status: DC

## 2014-05-07 MED ORDER — LIDOCAINE HCL 1 % IJ SOLN
30.0000 mL | Freq: Once | INTRAMUSCULAR | Status: AC
  Administered 2014-05-07: 30 mL
  Filled 2014-05-07: qty 40

## 2014-05-07 MED ORDER — TETANUS-DIPHTH-ACELL PERTUSSIS 5-2.5-18.5 LF-MCG/0.5 IM SUSP
0.5000 mL | Freq: Once | INTRAMUSCULAR | Status: AC
  Administered 2014-05-07: 0.5 mL via INTRAMUSCULAR
  Filled 2014-05-07: qty 0.5

## 2014-05-07 NOTE — ED Notes (Signed)
Bed: WA06 Expected date:  Expected time:  Means of arrival:  Comments: 

## 2014-05-07 NOTE — ED Notes (Signed)
MD Nanavati at bedside.  

## 2014-05-07 NOTE — ED Provider Notes (Signed)
CSN: [id number]     Arrival date & time 05/07/14  0718 History   First MD Initiated Contact with Patient 05/07/14 [phone]     Chief Complaint  Patient presents with  . finger laceration      (Consider location/radiation/quality/duration/timing/severity/associated sxs/prior Treatment) HPI Comments: Pt comes in with cc of finger laceration. Pt has no medical hx, he is unsure of his last tetanus. Pt was using a hatchet to chop some wood, and accidentally cut his index finger, L hand. Pt came to the ER immediately. He has no numbness, tingling, weakness. Bleeding stopped with pressure.  The history is provided by the patient.    History reviewed. No pertinent past medical history. History reviewed. No pertinent past surgical history. History reviewed. No pertinent family history. History  Substance Use Topics  . Smoking status: Current Every Day Smoker -- 1.00 packs/day for 5 years    Types: Cigarettes  . Smokeless tobacco: Not on file  . Alcohol Use: Yes     Comment: social    Review of Systems  Skin: Positive for wound.  Allergic/Immunologic: Negative for immunocompromised state.  Neurological: Negative for weakness and numbness.  Hematological: Bruises/bleeds easily.      Allergies  Review of patient's allergies indicates no known allergies.  Home Medications   Prior to Admission medications   Medication Sig Start Date End Date Taking? Authorizing Provider  diazepam (VALIUM) 5 MG tablet Take 1 tablet (5 mg total) by mouth every 8 (eight) hours as needed for anxiety. Patient not taking: Reported on 05/07/2014 12/05/13   Arman Filter, NP  diazepam (VALIUM) 5 MG tablet Take 1 tablet (5 mg total) by mouth every 8 (eight) hours as needed for anxiety. 12/05/13   Arman Filter, NP  ibuprofen (ADVIL,MOTRIN) 600 MG tablet Take 1 tablet (600 mg total) by mouth 3 (three) times daily. 12/05/13   Arman Filter, NP  ibuprofen (ADVIL,MOTRIN) 600 MG tablet Take 1 tablet (600 mg total) by mouth  every 6 (six) hours as needed. 12/05/13   Arman Filter, NP   BP 109/64 mmHg  Pulse 58  Temp(Src) 97.6 F (36.4 C) (Oral)  Resp 16  SpO2 100% Physical Exam  Constitutional: He is oriented to person, place, and time. He appears well-developed.  HENT:  Head: Normocephalic.  Eyes: Conjunctivae are normal.  Neck: Neck supple.  Cardiovascular: Normal rate.   Pulmonary/Chest: Effort normal.  Musculoskeletal:  L infex finger has a laceration proximal to the PIP, irregular, and wedge shaped with a flap. The length is 7 cm, and the laceration is on the dorsal and lateral aspect of the finger. Pt able to flex and extend using the intrinsic and extrinsic muscles.   Neurological: He is alert and oriented to person, place, and time.  Normal sensory exam, cap refills is normal  Nursing note and vitals reviewed.   ED Course  NERVE BLOCK Date/Time: 05/07/2014 9:19 AM Performed by: Derwood Kaplan Authorized by: Derwood Kaplan Consent: Verbal consent obtained. Risks and benefits: risks, benefits and alternatives were discussed Consent given by: patient Required items: required blood products, implants, devices, and special equipment available Patient identity confirmed: arm band Time out: Immediately prior to procedure a "time out" was called to verify the correct patient, procedure, equipment, support staff and site/side marked as required. Indications: pain relief Body area: upper extremity Nerve: digital Laterality: left Patient sedated: no Preparation: Patient was prepped and draped in the usual sterile fashion. Patient position: sitting Needle gauge: 27 G  Location technique: anatomical landmarks Local anesthetic: lidocaine 1% without epinephrine Anesthetic total: 5 ml Outcome: pain improved Patient tolerance: Patient tolerated the procedure well with no immediate complications   (including critical care time) Labs Review Labs Reviewed - No data to display  Imaging Review Dg  Finger Index Left  05/07/2014   CLINICAL DATA:  Transverse laceration across the dorsum of the proximal phalanx of the index finger.  EXAM: LEFT INDEX FINGER 2+V  COMPARISON:  None.  FINDINGS: There is soft tissue stranding about the dorsal lateral aspect of the proximal phalanx of the index finger. No associated fracture or radiopaque foreign body. Joint spaces are preserved.  IMPRESSION: Soft tissue stranding about the dorsal lateral aspect of the proximal phalanx of the index finger without associated fracture or radiopaque foreign body.   Electronically Signed   By: Simonne Come M.D.   On: 05/07/2014 08:15        EKG Interpretation None      Results for orders placed or performed during the hospital encounter of 09/26/11  I-STAT, chem 8  Result Value Ref Range   Sodium 139 135 - 145 mEq/L   Potassium 4.1 3.5 - 5.1 mEq/L   Chloride 103 96 - 112 mEq/L   BUN 12 6 - 23 mg/dL   Creatinine, Ser 0.86 0.50 - 1.35 mg/dL   Glucose, Bld 578 (H) 70 - 99 mg/dL   Calcium, Ion 4.69 6.29 - 1.32 mmol/L   TCO2 24 0 - 100 mmol/L   Hemoglobin 16.7 13.0 - 17.0 g/dL   HCT 52.8 41.3 - 24.4 %   Dg Finger Index Left  05/07/2014   CLINICAL DATA:  Transverse laceration across the dorsum of the proximal phalanx of the index finger.  EXAM: LEFT INDEX FINGER 2+V  COMPARISON:  None.  FINDINGS: There is soft tissue stranding about the dorsal lateral aspect of the proximal phalanx of the index finger. No associated fracture or radiopaque foreign body. Joint spaces are preserved.  IMPRESSION: Soft tissue stranding about the dorsal lateral aspect of the proximal phalanx of the index finger without associated fracture or radiopaque foreign body.   Electronically Signed   By: Simonne Come M.D.   On: 05/07/2014 08:15      MDM   Final diagnoses:  Laceration   Pt with L index finger injury.  Tetanus given. Wound irrigated extensively, and laceration repaired. Will give prophylactic antibiotics, as it was a dirty  wound.  Return precautions discussed.  LACERATION REPAIR Performed by: Derwood Kaplan Authorized by: Derwood Kaplan Consent: Verbal consent obtained. Risks and benefits: risks, benefits and alternatives were discussed Consent given by: patient Patient identity confirmed: provided demographic data Prepped and Draped in normal sterile fashion Wound explored  Laceration Location: L index finger  Laceration Length: 7 cm  No Foreign Bodies seen or palpated  Anesthesia: local infiltration  Local anesthetic: lidocaine 2 % without epinephrine  Anesthetic total: 5 ml   IRRIGATION / WOUND CARE  Irrigation method: syringe Amount of cleaning: coupious  Skin closure: primary  Number of sutures: 7  Technique: primary inturrupted  Patient tolerance: Patient tolerated the procedure well with no immediate complications.    The wound is cleansed, debrided of foreign material as much as possible, and dressed. The patient is alerted to watch for any signs of infection (redness, pus, pain, increased swelling or fever) and call if such occurs. Home wound care instructions are provided. Tetanus vaccination status reviewed: tetanus status unknown to the patient.  Derwood KaplanAnkit Roshaun Pound, MD 05/07/14 [phone]

## 2014-05-07 NOTE — ED Notes (Signed)
Pt alert, oriented, and ambulatory upon DC.  Patient  Was advised to follow up with UC in 1 week for suture removal. He as also advised to complete full course of antibiotics.

## 2014-05-07 NOTE — ED Notes (Signed)
Pt reports 30 min ago he was using a hatchet to chop wood and accidentally cut his left pointer finger near the proximal joint. Bleeding controlled. Pt denies pain. Full ROM of finger. Radial pulse strong.

## 2014-05-07 NOTE — Discharge Instructions (Signed)
We saw you in the ER for your WOUND. Please read the instructions provided on wound care. Keep the area clean and dry, apply bacitracin ointment daily and take the medications provided. RETURN TO THE ER IF THERE IS INCREASED PAIN, REDNESS, PUS COMING OUT from the wound site.  SUTURE REMOVAL NEEDS TO BE DONE IN 7-10 DAYS.   Laceration Care, Adult A laceration is a cut or lesion that goes through all layers of the skin and into the tissue just beneath the skin. TREATMENT  Some lacerations may not require closure. Some lacerations may not be able to be closed due to an increased risk of infection. It is important to see your caregiver as soon as possible after an injury to minimize the risk of infection and maximize the opportunity for successful closure. If closure is appropriate, pain medicines may be given, if needed. The wound will be cleaned to help prevent infection. Your caregiver will use stitches (sutures), staples, wound glue (adhesive), or skin adhesive strips to repair the laceration. These tools bring the skin edges together to allow for faster healing and a better cosmetic outcome. However, all wounds will heal with a scar. Once the wound has healed, scarring can be minimized by covering the wound with sunscreen during the day for 1 full year. HOME CARE INSTRUCTIONS  For sutures or staples:  Keep the wound clean and dry.  If you were given a bandage (dressing), you should change it at least once a day. Also, change the dressing if it becomes wet or dirty, or as directed by your caregiver.  Wash the wound with soap and water 2 times a day. Rinse the wound off with water to remove all soap. Pat the wound dry with a clean towel.  After cleaning, apply a thin layer of the antibiotic ointment as recommended by your caregiver. This will help prevent infection and keep the dressing from sticking.  You may shower as usual after the first 24 hours. Do not soak the wound in water until the  sutures are removed.  Only take over-the-counter or prescription medicines for pain, discomfort, or fever as directed by your caregiver.  Get your sutures or staples removed as directed by your caregiver. For skin adhesive strips:  Keep the wound clean and dry.  Do not get the skin adhesive strips wet. You may bathe carefully, using caution to keep the wound dry.  If the wound gets wet, pat it dry with a clean towel.  Skin adhesive strips will fall off on their own. You may trim the strips as the wound heals. Do not remove skin adhesive strips that are still stuck to the wound. They will fall off in time. For wound adhesive:  You may briefly wet your wound in the shower or bath. Do not soak or scrub the wound. Do not swim. Avoid periods of heavy perspiration until the skin adhesive has fallen off on its own. After showering or bathing, gently pat the wound dry with a clean towel.  Do not apply liquid medicine, cream medicine, or ointment medicine to your wound while the skin adhesive is in place. This may loosen the film before your wound is healed.  If a dressing is placed over the wound, be careful not to apply tape directly over the skin adhesive. This may cause the adhesive to be pulled off before the wound is healed.  Avoid prolonged exposure to sunlight or tanning lamps while the skin adhesive is in place. Exposure to ultraviolet  light in the first year will darken the scar.  The skin adhesive will usually remain in place for 5 to 10 days, then naturally fall off the skin. Do not pick at the adhesive film. You may need a tetanus shot if:  You cannot remember when you had your last tetanus shot.  You have never had a tetanus shot. If you get a tetanus shot, your arm may swell, get red, and feel warm to the touch. This is common and not a problem. If you need a tetanus shot and you choose not to have one, there is a rare chance of getting tetanus. Sickness from tetanus can be  serious. SEEK MEDICAL CARE IF:   You have redness, swelling, or increasing pain in the wound.  You see a red line that goes away from the wound.  You have yellowish-white fluid (pus) coming from the wound.  You have a fever.  You notice a bad smell coming from the wound or dressing.  Your wound breaks open before or after sutures have been removed.  You notice something coming out of the wound such as wood or glass.  Your wound is on your hand or foot and you cannot move a finger or toe. SEEK IMMEDIATE MEDICAL CARE IF:   Your pain is not controlled with prescribed medicine.  You have severe swelling around the wound causing pain and numbness or a change in color in your arm, hand, leg, or foot.  Your wound splits open and starts bleeding.  You have worsening numbness, weakness, or loss of function of any joint around or beyond the wound.  You develop painful lumps near the wound or on the skin anywhere on your body. MAKE SURE YOU:   Understand these instructions.  Will watch your condition.  Will get help right away if you are not doing well or get worse. Document Released: 03/17/2005 Document Revised: 06/09/2011 Document Reviewed: 09/10/2010 [facility] Patient Information 2015 [place], [place]. This information is not intended to replace advice given to you by your health care provider. Make sure you discuss any questions you have with your health care provider.  Stitches, Staples, or Skin Adhesive Strips  Stitches (sutures), staples, and skin adhesive strips hold the skin together as it heals. They will usually be in place for 7 days or less. HOME CARE  Wash your hands with soap and water before and after you touch your wound.  Only take medicine as told by your doctor.  Cover your wound only if your doctor told you to. Otherwise, leave it open to air.  Do not get your stitches wet or dirty. If they get dirty, dab them gently with a clean washcloth. Wet the washcloth  with soapy water. Do not rub. Pat them dry gently.  Do not put medicine or medicated cream on your stitches unless your doctor told you to.  Do not take out your own stitches or staples. Skin adhesive strips will fall off by themselves.  Do not pick at the wound. Picking can cause an infection.  Do not miss your follow-up appointment.  If you have problems or questions, call your doctor. GET HELP RIGHT AWAY IF:   You have a temperature by mouth above 102 F (38.9 C), not controlled by medicine.  You have chills.  You have redness or pain around your stitches.  There is puffiness (swelling) around your stitches.  You notice fluid (drainage) from your stitches.  There is a bad smell coming from your  wound. MAKE SURE YOU:  Understand these instructions.  Will watch your condition.  Will get help if you are not doing well or get worse. Document Released: 01/12/2009 Document Revised: 06/09/2011 Document Reviewed: 01/12/2009 [facility] Patient Information 2015 [place], [place]. This information is not intended to replace advice given to you by your health care provider. Make sure you discuss any questions you have with your health care provider. Dressing Change A dressing is a material placed over wounds. It keeps the wound clean, dry, and protected from further injury. This provides an environment that favors wound healing.  BEFORE YOU BEGIN  Get your supplies together. Things you may need include:  Saline solution.  Flexible gauze dressing.  Medicated cream.  Tape.  Gloves.  Abdominal dressing pads.  Gauze squares.  Plastic bags.  Take pain medicine 30 minutes before the dressing change if you need it.  Take a shower before you do the first dressing change of the day. Use plastic wrap or a plastic bag to prevent the dressing from getting wet. REMOVING YOUR OLD DRESSING   Wash your hands with soap and water. Dry your hands with a clean towel.  Put on your  gloves.  Remove any tape.  Carefully remove the old dressing. If the dressing sticks, you may dampen it with warm water to loosen it, or follow your caregiver's specific directions.  Remove any gauze or packing tape that is in your wound.  Take off your gloves.  Put the gloves, tape, gauze, or any packing tape into a plastic bag. CHANGING YOUR DRESSING  Open the supplies.  Take the cap off the saline solution.  Open the gauze package so that the gauze remains on the inside of the package.  Put on your gloves.  Clean your wound as told by your caregiver.  If you have been told to keep your wound dry, follow those instructions.  Your caregiver may tell you to do one or more of the following:  Pick up the gauze. Pour the saline solution over the gauze. Squeeze out the extra saline solution.  Put medicated cream or other medicine on your wound if you have been told to do so.  Put the solution soaked gauze only in your wound, not on the skin around it.  Pack your wound loosely or as told by your caregiver.  Put dry gauze on your wound.  Put abdominal dressing pads over the dry gauze if your wet gauze soaks through.  Tape the abdominal dressing pads in place so they will not fall off. Do not wrap the tape completely around the affected part (arm, leg, abdomen).  Wrap the dressing pads with a flexible gauze dressing to secure it in place.  Take off your gloves. Put them in the plastic bag with the old dressing. Tie the bag shut and throw it away.  Keep the dressing clean and dry until your next dressing change.  Wash your hands. SEEK MEDICAL CARE IF:  Your skin around the wound looks red.  Your wound feels more tender or sore.  You see pus in the wound.  Your wound smells bad.  You have a fever.  Your skin around the wound has a rash that itches and burns.  You see black or yellow skin in your wound that was not there before.  You feel nauseous, throw up, and  feel very tired. Document Released: 04/24/2004 Document Revised: 06/09/2011 Document Reviewed: 01/27/2011 [facility] Patient Information 2015 [place], [place]. This information is not  intended to replace advice given to you by your health care provider. Make sure you discuss any questions you have with your health care provider. ° °

## 2014-12-12 ENCOUNTER — Emergency Department (HOSPITAL_COMMUNITY): Payer: Self-pay

## 2014-12-12 ENCOUNTER — Emergency Department (HOSPITAL_COMMUNITY)
Admission: EM | Admit: 2014-12-12 | Discharge: 2014-12-12 | Disposition: A | Discharge status: Home / Self Care | Payer: Self-pay | Attending: Emergency Medicine | Admitting: Emergency Medicine

## 2014-12-12 ENCOUNTER — Encounter (HOSPITAL_COMMUNITY): Payer: Self-pay | Admitting: Emergency Medicine

## 2014-12-12 DIAGNOSIS — Z791 Long term (current) use of non-steroidal anti-inflammatories (NSAID): Secondary | ICD-10-CM | POA: Insufficient documentation

## 2014-12-12 DIAGNOSIS — M79641 Pain in right hand: Secondary | ICD-10-CM

## 2014-12-12 DIAGNOSIS — Y998 Other external cause status: Secondary | ICD-10-CM | POA: Insufficient documentation

## 2014-12-12 DIAGNOSIS — S62309A Unspecified fracture of unspecified metacarpal bone, initial encounter for closed fracture: Secondary | ICD-10-CM

## 2014-12-12 DIAGNOSIS — Z720 Tobacco use: Secondary | ICD-10-CM | POA: Insufficient documentation

## 2014-12-12 DIAGNOSIS — W11XXXA Fall on and from ladder, initial encounter: Secondary | ICD-10-CM | POA: Insufficient documentation

## 2014-12-12 DIAGNOSIS — S62336A Displaced fracture of neck of fifth metacarpal bone, right hand, initial encounter for closed fracture: Secondary | ICD-10-CM | POA: Insufficient documentation

## 2014-12-12 DIAGNOSIS — Y9389 Activity, other specified: Secondary | ICD-10-CM | POA: Insufficient documentation

## 2014-12-12 DIAGNOSIS — Z792 Long term (current) use of antibiotics: Secondary | ICD-10-CM | POA: Insufficient documentation

## 2014-12-12 DIAGNOSIS — Y9289 Other specified places as the place of occurrence of the external cause: Secondary | ICD-10-CM | POA: Insufficient documentation

## 2014-12-12 MED ORDER — NAPROXEN 500 MG PO TABS: 500.0000 mg | ORAL_TABLET | Freq: Two times a day (BID) | ORAL | Status: DC | PRN

## 2014-12-12 MED ORDER — HYDROCODONE-ACETAMINOPHEN 5-325 MG PO TABS
1.0000 | ORAL_TABLET | Freq: Once | ORAL | Status: AC
  Administered 2014-12-12: 1 via ORAL
  Filled 2014-12-12: qty 1

## 2014-12-12 NOTE — ED Provider Notes (Signed)
CSN: [id number]     Arrival date & time 12/12/14  0753 History   First MD Initiated Contact with Patient 12/12/14 0757     Chief Complaint  Patient presents with  . Hand Injury     (Consider location/radiation/quality/duration/timing/severity/associated sxs/prior Treatment) HPI Comments: Benjamin Chandler is a 34 y.o. male who presents to the ED with complaints of right hand pain 1 day. Patient reports that he "fell off a ladder and tried to catch himself with his right hand", landing on the ground and only injuring his right hand. He is unable to recall whether he had an outstretched hand or if his hand was in a fist. He is unsure of the height of the fall. He is right-handed. He describes the pain is 10/10 constant throbbing nonradiating pain located over the fourth and fifth metacarpals, worse with movement of his hand, and unrelieved with ice. Associated symptoms include swelling and bruising to the area. He denies any abrasions, skin injury, head injury or loss of consciousness, other extremity pain, numbness, tingling, weakness, loss of range of motion in the hand, chest pain, shortness breath, abdominal pain, nausea, or vomiting. No medical problems, no known drug allergies.   Patient is a 34 y.o. male presenting with hand injury. The history is provided by the patient. No language interpreter was used.  Hand Injury Location:  Hand Time since incident:  1 day Injury: yes   Mechanism of injury: fall   Fall:    Fall occurred:  From a ladder   Height of fall:  Unknown   Impact surface:  Grass   Point of impact:  Hands   Entrapped after fall: no   Hand location:  R hand Pain details:    Quality:  Throbbing   Radiates to:  Does not radiate   Severity:  Severe   Onset quality:  Sudden   Duration:  1 day   Timing:  Constant   Progression:  Unchanged Chronicity:  New Handedness:  Right-handed Foreign body present:  No foreign bodies Prior injury to area:  No Relieved by:   Nothing Worsened by:  Movement Ineffective treatments:  Ice Associated symptoms: swelling   Associated symptoms: no decreased range of motion, no muscle weakness, no numbness and no tingling     No past medical history on file. No past surgical history on file. No family history on file. Social History  Substance Use Topics  . Smoking status: Current Every Day Smoker -- 1.00 packs/day for 5 years    Types: Cigarettes  . Smokeless tobacco: Not on file  . Alcohol Use: Yes     Comment: social    Review of Systems  HENT: Negative for facial swelling (no head inj).   Respiratory: Negative for shortness of breath.   Cardiovascular: Negative for chest pain.  Gastrointestinal: Negative for nausea, vomiting and abdominal pain.  Musculoskeletal: Positive for joint swelling and arthralgias (R hand). Negative for myalgias.  Skin: Positive for color change (bruising). Negative for wound.  Allergic/Immunologic: Negative for immunocompromised state.  Neurological: Negative for syncope, weakness and numbness.  Psychiatric/Behavioral: Negative for confusion.   10 Systems reviewed and are negative for acute change except as noted in the HPI.    Allergies  Review of patient's allergies indicates no known allergies.  Home Medications   Prior to Admission medications   Medication Sig Start Date End Date Taking? Authorizing Provider  cephALEXin (KEFLEX) 500 MG capsule Take 1 capsule (500 mg total) by mouth 4 (four) times  daily. 05/07/14   Derwood Kaplan, MD  diazepam (VALIUM) 5 MG tablet Take 1 tablet (5 mg total) by mouth every 8 (eight) hours as needed for anxiety. Patient not taking: Reported on 05/07/2014 12/05/13   Earley Favor, NP  diazepam (VALIUM) 5 MG tablet Take 1 tablet (5 mg total) by mouth every 8 (eight) hours as needed for anxiety. 12/05/13   Earley Favor, NP  ibuprofen (ADVIL,MOTRIN) 600 MG tablet Take 1 tablet (600 mg total) by mouth 3 (three) times daily. 12/05/13   Earley Favor, NP   ibuprofen (ADVIL,MOTRIN) 600 MG tablet Take 1 tablet (600 mg total) by mouth every 6 (six) hours as needed. 12/05/13   Earley Favor, NP  sulfamethoxazole-trimethoprim (SEPTRA DS) 800-160 MG per tablet Take 1 tablet by mouth every 12 (twelve) hours. 05/07/14   Ankit Rhunette Croft, MD   BP 127/81 mmHg  Pulse 78  Temp(Src) 97.4 F (36.3 C) (Oral)  Resp 18  SpO2 100% Physical Exam  Constitutional: He is oriented to person, place, and time. Vital signs are normal. He appears well-developed and well-nourished.  Non-toxic appearance. No distress.  Afebrile, nontoxic, NAD  HENT:  Head: Normocephalic and atraumatic.  Mouth/Throat: Mucous membranes are normal.  Eyes: Conjunctivae and EOM are normal. Right eye exhibits no discharge. Left eye exhibits no discharge.  Neck: Normal range of motion. Neck supple.  Cardiovascular: Normal rate and intact distal pulses.   Pulmonary/Chest: Effort normal. No respiratory distress.  Abdominal: Normal appearance. He exhibits no distension.  Musculoskeletal:       Right hand: He exhibits decreased range of motion (due to pain), tenderness, bony tenderness, deformity and swelling. He exhibits normal two-point discrimination, normal capillary refill and no laceration. Normal sensation noted. Normal strength noted.  R hand with slightly diminished ROM to 4-5th digits due to pain, swelling to the dorsum of the ulnar aspect of the hand, with TTP along 4-5th metacarpals, obvious deformity to these knuckles, cap refill brisk and present, no lacerations or skin injuries, slight bruising, with intact strength and sensation. ROM preserved at MCP, PIP, and DIP joints.  Neurological: He is alert and oriented to person, place, and time. He has normal strength. No sensory deficit.  Skin: Skin is warm, dry and intact. No rash noted.  Psychiatric: He has a normal mood and affect.  Nursing note and vitals reviewed.   ED Course  Procedures (including critical care time) Labs  Review Labs Reviewed - No data to display  Imaging Review Dg Hand Complete Right  12/12/2014   CLINICAL DATA:  Fall from ladder yesterday. Hand pain and swelling. Initial encounter.  EXAM: RIGHT HAND - COMPLETE 3+ VIEW  COMPARISON:  None.  FINDINGS: There is an acute mildly displaced and angulated fracture of the fifth metacarpal neck. There is no involvement of the metacarpal head articular surface. No dislocation or other acute osseous findings demonstrated. Soft tissue swelling is noted within the ulnar aspect of the hand.  IMPRESSION: Mildly displaced and angulated boxer's fracture.   Electronically Signed   By: Carey Bullocks M.D.   On: 12/12/2014 08:29   I have personally reviewed and evaluated these images and lab results as part of my medical decision-making.   EKG Interpretation None      MDM   Final diagnoses:  Right hand pain  Metacarpal bone fracture, closed, initial encounter    34 y.o. male here with "fall" and R hand injury, states he fell off ladder and only injury was to his hand, tenderness to  4-5th metacarpals, appears to be boxer's injury. No skin abrasions or opening. Neurovascularly intact. Will give pain meds and obtain xray. Will reassess shortly.   8:52 AM Xray with mildly displaced angulated fx of 5th metacarpal neck, no articular surface involvement. Will place in ulnar gutter splint and have pt f/up with hand specialist in 3-5 days. Naprosyn rx given. No need for abx given that there are no teeth marks or skin injuries. RICE therapy discussed. I explained the diagnosis and have given explicit precautions to return to the ER including for any other new or worsening symptoms. The patient understands and accepts the medical plan as it's been dictated and I have answered their questions. Discharge instructions concerning home care and prescriptions have been given. The patient is STABLE and is discharged to home in good condition.  BP 127/81 mmHg  Pulse 78   Temp(Src) 97.4 F (36.3 C) (Oral)  Resp 18  SpO2 100%  Meds ordered this encounter  Medications  . HYDROcodone-acetaminophen (NORCO/VICODIN) 5-325 MG per tablet 1 tablet    Sig:   . naproxen (NAPROSYN) 500 MG tablet    Sig: Take 1 tablet (500 mg total) by mouth 2 (two) times daily as needed for mild pain, moderate pain or headache (TAKE WITH MEALS.).    Dispense:  20 tablet    Refill:  0    Order Specific Question:  Supervising Provider    Answer:  Eber Hong [[id number]]     Chamar Broughton Camprubi-Soms, PA-C 12/12/14 1610  Donnetta Hutching, MD 12/12/14 1240

## 2014-12-12 NOTE — Discharge Instructions (Signed)
Wear hand/wrist splint at all times. Ice and elevate hand throughout the day. Alternate between naprosyn and tylenol for pain relief. Call hand specialist follow up today or tomorrow to schedule followup appointment in 3-5 days. Return to the ER for changes or worsening symptoms.    Boxer's Fracture You have a break (fracture) of the fifth metacarpal bone. This is commonly called a boxer's fracture. This is the bone in the hand where the little finger attaches. The fracture is in the end of that bone, closest to the little finger. It is usually caused when you hit an object with a clenched fist. Often, the knuckle is pushed down by the impact. Sometimes, the fracture rotates out of position. A boxer's fracture will usually heal within 6 weeks, if it is treated properly and protected from re-injury. Surgery is sometimes needed. A cast, splint, or bulky hand dressing may be used to protect and immobilize a boxer's fracture. Do not remove this device or dressing until your caregiver approves. Keep your hand elevated, and apply ice packs for 15-20 minutes every 2 hours, for the first 2 days. Elevation and ice help reduce swelling and relieve pain. See your caregiver, or an orthopedic specialist, for follow-up care within the next 10 days. This is to make sure your fracture is healing properly. Document Released: 03/17/2005 Document Revised: 06/09/2011 Document Reviewed: 09/04/2006 [facility] Patient Information 2015 [place], [place]. This information is not intended to replace advice given to you by your health care provider. Make sure you discuss any questions you have with your health care provider.  Cast or Splint Care Casts and splints support injured limbs and keep bones from moving while they heal. It is important to care for your cast or splint at home.  HOME CARE INSTRUCTIONS  Keep the cast or splint uncovered during the drying period. It can take 24 to 48 hours to dry if it is made of plaster. A  fiberglass cast will dry in less than 1 hour.  Do not rest the cast on anything harder than a pillow for the first 24 hours.  Do not put weight on your injured limb or apply pressure to the cast until your health care provider gives you permission.  Keep the cast or splint dry. Wet casts or splints can lose their shape and may not support the limb as well. A wet cast that has lost its shape can also create harmful pressure on your skin when it dries. Also, wet skin can become infected.  Cover the cast or splint with a plastic bag when bathing or when out in the rain or snow. If the cast is on the trunk of the body, take sponge baths until the cast is removed.  If your cast does become wet, dry it with a towel or a blow dryer on the cool setting only.  Keep your cast or splint clean. Soiled casts may be wiped with a moistened cloth.  Do not place any hard or soft foreign objects under your cast or splint, such as cotton, toilet paper, lotion, or powder.  Do not try to scratch the skin under the cast with any object. The object could get stuck inside the cast. Also, scratching could lead to an infection. If itching is a problem, use a blow dryer on a cool setting to relieve discomfort.  Do not trim or cut your cast or remove padding from inside of it.  Exercise all joints next to the injury that are not immobilized by  the cast or splint. For example, if you have a long leg cast, exercise the hip joint and toes. If you have an arm cast or splint, exercise the shoulder, elbow, thumb, and fingers.  Elevate your injured arm or leg on 1 or 2 pillows for the first 1 to 3 days to decrease swelling and pain.It is best if you can comfortably elevate your cast so it is higher than your heart. SEEK MEDICAL CARE IF:   Your cast or splint cracks.  Your cast or splint is too tight or too loose.  You have unbearable itching inside the cast.  Your cast becomes wet or develops a soft spot or  area.  You have a bad smell coming from inside your cast.  You get an object stuck under your cast.  Your skin around the cast becomes red or raw.  You have new pain or worsening pain after the cast has been applied. SEEK IMMEDIATE MEDICAL CARE IF:   You have fluid leaking through the cast.  You are unable to move your fingers or toes.  You have discolored (blue or white), cool, painful, or very swollen fingers or toes beyond the cast.  You have tingling or numbness around the injured area.  You have severe pain or pressure under the cast.  You have any difficulty with your breathing or have shortness of breath.  You have chest pain. Document Released: 03/14/2000 Document Revised: 01/05/2013 Document Reviewed: 09/23/2012 [facility] Patient Information 2015 [place], [place]. This information is not intended to replace advice given to you by your health care provider. Make sure you discuss any questions you have with your health care provider.  Cryotherapy Cryotherapy is when you put ice on your injury. Ice helps lessen pain and puffiness (swelling) after an injury. Ice works the best when you start using it in the first 24 to 48 hours after an injury. HOME CARE  Put a dry or damp towel between the ice pack and your skin.  You may press gently on the ice pack.  Leave the ice on for no more than 10 to 20 minutes at a time.  Check your skin after 5 minutes to make sure your skin is okay.  Rest at least 20 minutes between ice pack uses.  Stop using ice when your skin loses feeling (numbness).  Do not use ice on someone who cannot tell you when it hurts. This includes small children and people with memory problems (dementia). GET HELP RIGHT AWAY IF:  You have white spots on your skin.  Your skin turns blue or pale.  Your skin feels waxy or hard.  Your puffiness gets worse. MAKE SURE YOU:   Understand these instructions.  Will watch your condition.  Will get help  right away if you are not doing well or get worse. Document Released: 09/03/2007 Document Revised: 06/09/2011 Document Reviewed: 11/07/2010 [facility] Patient Information 2015 [place], [place]. This information is not intended to replace advice given to you by your health care provider. Make sure you discuss any questions you have with your health care provider.

## 2014-12-12 NOTE — ED Notes (Signed)
Ortho at bedside placing splint

## 2014-12-12 NOTE — ED Notes (Signed)
Patient states fell off ladder and landed on R hand.  Denies other injuries.   R hand swollen.

## 2014-12-12 NOTE — Progress Notes (Signed)
Orthopedic Tech Progress Note Patient Details:  Benjamin Chandler 1980/10/22 [id number]  Ortho Devices Type of Ortho Device: Ace wrap, Ulna gutter splint Ortho Device/Splint Location: rue Ortho Device/Splint Interventions: Application   Lizvet Chunn 12/12/2014, 9:27 AM

## 2015-07-10 ENCOUNTER — Encounter (HOSPITAL_COMMUNITY): Payer: Self-pay | Admitting: Emergency Medicine

## 2015-07-10 ENCOUNTER — Emergency Department (HOSPITAL_COMMUNITY)
Admission: EM | Admit: 2015-07-10 | Discharge: 2015-07-10 | Disposition: A | Discharge status: Home / Self Care | Payer: Self-pay | Attending: Emergency Medicine | Admitting: Emergency Medicine

## 2015-07-10 DIAGNOSIS — Z791 Long term (current) use of non-steroidal anti-inflammatories (NSAID): Secondary | ICD-10-CM | POA: Insufficient documentation

## 2015-07-10 DIAGNOSIS — Z792 Long term (current) use of antibiotics: Secondary | ICD-10-CM | POA: Insufficient documentation

## 2015-07-10 DIAGNOSIS — F17210 Nicotine dependence, cigarettes, uncomplicated: Secondary | ICD-10-CM | POA: Insufficient documentation

## 2015-07-10 DIAGNOSIS — K047 Periapical abscess without sinus: Secondary | ICD-10-CM | POA: Insufficient documentation

## 2015-07-10 MED ORDER — LIDOCAINE VISCOUS 2 % MT SOLN
15.0000 mL | Freq: Once | OROMUCOSAL | Status: AC
  Administered 2015-07-10: 15 mL via OROMUCOSAL
  Filled 2015-07-10: qty 15

## 2015-07-10 MED ORDER — BUPIVACAINE-EPINEPHRINE (PF) 0.5% -1:200000 IJ SOLN
1.8000 mL | Freq: Once | INTRAMUSCULAR | Status: DC
  Filled 2015-07-10: qty 1.8

## 2015-07-10 MED ORDER — CLINDAMYCIN HCL 150 MG PO CAPS: 300.0000 mg | ORAL_CAPSULE | Freq: Four times a day (QID) | ORAL | Status: DC

## 2015-07-10 MED ORDER — OXYCODONE-ACETAMINOPHEN 5-325 MG PO TABS
1.0000 | ORAL_TABLET | Freq: Once | ORAL | Status: AC
  Administered 2015-07-10: 1 via ORAL
  Filled 2015-07-10: qty 1

## 2015-07-10 MED ORDER — CLINDAMYCIN HCL 150 MG PO CAPS: 450.0000 mg | ORAL_CAPSULE | Freq: Three times a day (TID) | ORAL | Status: DC

## 2015-07-10 MED ORDER — CLINDAMYCIN HCL 300 MG PO CAPS
450.0000 mg | ORAL_CAPSULE | Freq: Once | ORAL | Status: AC
  Administered 2015-07-10: 450 mg via ORAL
  Filled 2015-07-10: qty 1

## 2015-07-10 NOTE — ED Notes (Signed)
Per pt, states right facial swelling and pain for 2 days-not sure if it related to sinuses or a dental issue

## 2015-07-10 NOTE — ED Provider Notes (Signed)
CSN: [id number]     Arrival date & time 07/10/15  1030 History   First MD Initiated Contact with Patient 07/10/15 1100     Chief Complaint  Patient presents with  . Facial Swelling     (Consider location/radiation/quality/duration/timing/severity/associated sxs/prior Treatment) The history is provided by the patient and medical records. No language interpreter was used.   Benjamin Chandler is a 35 y.o. male  with no pertinent PMH who presents to the Emergency Department complaining of worsening right-sided facial pain and swelling since Sunday (2 days ago). Difficulty sleeping secondary to pain. Patient denies any other associated symptoms including fever, shortness of breath, difficulty breathing, difficulty swallowing, sinus pressure, nasal congestion, recent illness, neck pain, chest pain, nausea, vomiting, abdominal pain. No alleviating or aggravating factors noted. OTC pain relief with no relief.   History reviewed. No pertinent past medical history. History reviewed. No pertinent past surgical history. No family history on file. Social History  Substance Use Topics  . Smoking status: Current Every Day Smoker -- 1.00 packs/day for 5 years    Types: Cigarettes  . Smokeless tobacco: None  . Alcohol Use: Yes     Comment: social    Review of Systems  Constitutional: Negative for fever and chills.  HENT: Negative for congestion, sore throat and trouble swallowing.   Eyes: Negative for visual disturbance.  Respiratory: Negative for cough and shortness of breath.   Cardiovascular: Negative.   Gastrointestinal: Negative for nausea, vomiting and abdominal pain.  Musculoskeletal: Negative for neck pain and neck stiffness.  Skin: Negative for rash.  Allergic/Immunologic: Negative for immunocompromised state.  Neurological: Negative for dizziness and headaches.      Allergies  Review of patient's allergies indicates no known allergies.  Home Medications   Prior to Admission  medications   Medication Sig Start Date End Date Taking? Authorizing Provider  cephALEXin (KEFLEX) 500 MG capsule Take 1 capsule (500 mg total) by mouth 4 (four) times daily. 05/07/14   Derwood Kaplan, MD  clindamycin (CLEOCIN) 150 MG capsule Take 2 capsules (300 mg total) by mouth 4 (four) times daily. 07/10/15   Chase Picket Ludmilla Mcgillis, PA-C  diazepam (VALIUM) 5 MG tablet Take 1 tablet (5 mg total) by mouth every 8 (eight) hours as needed for anxiety. Patient not taking: Reported on 05/07/2014 12/05/13   Earley Favor, NP  diazepam (VALIUM) 5 MG tablet Take 1 tablet (5 mg total) by mouth every 8 (eight) hours as needed for anxiety. 12/05/13   Earley Favor, NP  ibuprofen (ADVIL,MOTRIN) 600 MG tablet Take 1 tablet (600 mg total) by mouth 3 (three) times daily. 12/05/13   Earley Favor, NP  ibuprofen (ADVIL,MOTRIN) 600 MG tablet Take 1 tablet (600 mg total) by mouth every 6 (six) hours as needed. 12/05/13   Earley Favor, NP  naproxen (NAPROSYN) 500 MG tablet Take 1 tablet (500 mg total) by mouth 2 (two) times daily as needed for mild pain, moderate pain or headache (TAKE WITH MEALS.). 12/12/14   Mercedes Camprubi-Soms, PA-C  sulfamethoxazole-trimethoprim (SEPTRA DS) 800-160 MG per tablet Take 1 tablet by mouth every 12 (twelve) hours. 05/07/14   Ankit Nanavati, MD   BP 135/82 mmHg  Pulse 78  Temp(Src) 98.2 F (36.8 C) (Oral)  Resp 16  SpO2 100% Physical Exam  Constitutional: He is oriented to person, place, and time. He appears well-developed and well-nourished.  Alert, speaking in full sentences, and in no acute distress.  HENT:  Head: Normocephalic and atraumatic.  Mouth/Throat:    +  right sided facial edema. Midline uvula, oropharynx moist and clear, no oropharyngeal erythema or edema, neck supple and no tenderness. No sublingual tenderness.  Neck: Neck supple.  Full range of motion without pain. No tenderness to palpation of any neck spaces including submental and submandibular spaces.  Cardiovascular: Normal  rate, regular rhythm and normal heart sounds.  Exam reveals no gallop and no friction rub.   No murmur heard. Pulmonary/Chest: Effort normal and breath sounds normal. No respiratory distress. He has no wheezes. He has no rales. He exhibits no tenderness.  Abdominal: Soft. He exhibits no distension. There is no tenderness.  Neurological: He is alert and oriented to person, place, and time.  Skin: Skin is warm and dry.  Nursing note and vitals reviewed.   ED Course  Procedures (including critical care time)  INCISION AND DRAINAGE Performed by: Chase PicketJaime Pilcher Ahmia Colford Consent: Verbal consent obtained. Risks and benefits: risks, benefits and alternatives were discussed Type: dental abscess Body area: Right lower molar Anesthesia: local infiltration Incision was made with a 23 G   Local anesthetic: marcaine with 0.5% epi  Anesthetic total: 1.8 ml  Complexity: simple Drainage: purulent Drainage amount: small  Patient tolerance: Patient tolerated the procedure well with no immediate complications.  NERVE BLOCK Performed by: Chase PicketJaime Pilcher Tyona Nilsen Consent: Verbal consent obtained. Required items: required blood products, implants, devices, and special equipment available Time out: Immediately prior to procedure a "time out" was called to verify the correct patient, procedure, equipment, support staff as required. Indication: dental pain relief Nerve block body site: right lower molar   Preparation: Patient was prepped and draped in the usual sterile fashion. Needle gauge: 23 G Location technique: anatomical landmarks  Local anesthetic: Marcaine with 0.5% epi  Anesthetic total: 1.8 ml  Outcome: pain improved Patient tolerance: Patient tolerated the procedure well with no immediate complications.  Labs Review Labs Reviewed - No data to display  Imaging Review No results found. I have personally reviewed and evaluated these images and lab results as part of my medical  decision-making.   EKG Interpretation None      MDM   Final diagnoses:  Dental infection   Patient with dentalgia. On exam, patient had a superficial dental abscess which was already mildly open and would drain with palpation. I used a 23-gauge needle to perform a dental nerve block and also drained the abscess as dictated above. After procedure, patient stated noticeable pain relief and stated he felt a decreased pressure as well. Patient was reevaluated after procedure and abscess appears fully drained. He is afebrile and nontoxic appearing. Swallowing secretions well. Airway patent. He has no trouble breathing or swallowing. No concern for pharyngeal infection or Ludwig's angina. Will treat with Clinda. Patient has dentist to follow-up with and stressed the importance of dental follow up for ultimate management of dental pain. Patient voices understanding and is agreeable to plan.   [facility] LtdJaime Pilcher Cassandria Drew, PA-C 07/10/15 1240  Azalia BilisKevin Campos, MD 07/10/15 [phone]

## 2015-07-10 NOTE — Discharge Instructions (Signed)
You have a dental injury. It is very important that you get evaluated by a dentist as soon as possible. Call tomorrow to schedule an appointment. Ibuprofen or tylenol for pain. Take your full course of antibiotics. Read the instructions below.  Eat a soft or liquid diet and rinse your mouth out after meals with warm water. You should see a dentist or return here at once if you have increased swelling, increased pain or uncontrolled bleeding from the site of your injury.  SEEK MEDICAL CARE IF:   You have increased pain not controlled with medicines.   You have swelling around your tooth, in your face or neck.   You have bleeding which starts, continues, or gets worse.   You have a fever >101  If you are unable to open your mouth

## 2015-07-10 NOTE — ED Notes (Signed)
Bed: WA26 Expected date:  Expected time:  Means of arrival:  Comments: 

## 2015-07-25 ENCOUNTER — Observation Stay (HOSPITAL_COMMUNITY)
Admission: EM | Admit: 2015-07-25 | Discharge: 2015-07-26 | Disposition: A | Discharge status: Home / Self Care | Payer: Self-pay | Attending: Family Medicine | Admitting: Family Medicine

## 2015-07-25 ENCOUNTER — Emergency Department (HOSPITAL_COMMUNITY): Payer: Self-pay

## 2015-07-25 ENCOUNTER — Encounter (HOSPITAL_COMMUNITY): Payer: Self-pay

## 2015-07-25 DIAGNOSIS — Z809 Family history of malignant neoplasm, unspecified: Secondary | ICD-10-CM | POA: Insufficient documentation

## 2015-07-25 DIAGNOSIS — L03211 Cellulitis of face: Secondary | ICD-10-CM | POA: Diagnosis present

## 2015-07-25 DIAGNOSIS — K047 Periapical abscess without sinus: Secondary | ICD-10-CM | POA: Diagnosis present

## 2015-07-25 DIAGNOSIS — R519 Headache, unspecified: Secondary | ICD-10-CM

## 2015-07-25 DIAGNOSIS — Z79899 Other long term (current) drug therapy: Secondary | ICD-10-CM | POA: Insufficient documentation

## 2015-07-25 DIAGNOSIS — F17210 Nicotine dependence, cigarettes, uncomplicated: Secondary | ICD-10-CM | POA: Insufficient documentation

## 2015-07-25 DIAGNOSIS — R51 Headache: Secondary | ICD-10-CM

## 2015-07-25 DIAGNOSIS — K122 Cellulitis and abscess of mouth: Principal | ICD-10-CM | POA: Insufficient documentation

## 2015-07-25 DIAGNOSIS — K05219 Aggressive periodontitis, localized, unspecified severity: Secondary | ICD-10-CM | POA: Insufficient documentation

## 2015-07-25 DIAGNOSIS — L0201 Cutaneous abscess of face: Secondary | ICD-10-CM

## 2015-07-25 LAB — BASIC METABOLIC PANEL
ANION GAP: 7 (ref 5–15)
BUN: 11 mg/dL (ref 6–20)
CALCIUM: 9.6 mg/dL (ref 8.9–10.3)
CO2: 25 mmol/L (ref 22–32)
Chloride: 105 mmol/L (ref 101–111)
Creatinine, Ser: 0.8 mg/dL (ref 0.61–1.24)
Glucose, Bld: 98 mg/dL (ref 65–99)
POTASSIUM: 4.4 mmol/L (ref 3.5–5.1)
SODIUM: 137 mmol/L (ref 135–145)

## 2015-07-25 LAB — CBC WITH DIFFERENTIAL/PLATELET
BASOS ABS: 0 10*3/uL (ref 0.0–0.1)
BASOS PCT: 0 %
EOS PCT: 3 %
Eosinophils Absolute: 0.3 10*3/uL (ref 0.0–0.7)
HCT: 44.9 % (ref 39.0–52.0)
Hemoglobin: 15.6 g/dL (ref 13.0–17.0)
LYMPHS PCT: 18 %
Lymphs Abs: 1.6 10*3/uL (ref 0.7–4.0)
MCH: 30.5 pg (ref 26.0–34.0)
MCHC: 34.7 g/dL (ref 30.0–36.0)
MCV: 87.9 fL (ref 78.0–100.0)
MONO ABS: 0.8 10*3/uL (ref 0.1–1.0)
Monocytes Relative: 9 %
Neutro Abs: 6.2 10*3/uL (ref 1.7–7.7)
Neutrophils Relative %: 70 %
PLATELETS: 229 10*3/uL (ref 150–400)
RBC: 5.11 MIL/uL (ref 4.22–5.81)
RDW: 12.2 % (ref 11.5–15.5)
WBC: 8.9 10*3/uL (ref 4.0–10.5)

## 2015-07-25 MED ORDER — ENOXAPARIN SODIUM 40 MG/0.4ML ~~LOC~~ SOLN: 40.0000 mg | SUBCUTANEOUS | Status: DC

## 2015-07-25 MED ORDER — MORPHINE SULFATE (PF) 2 MG/ML IV SOLN
2.0000 mg | INTRAVENOUS | Status: DC | PRN
  Administered 2015-07-25: 2 mg via INTRAVENOUS
  Filled 2015-07-25: qty 1

## 2015-07-25 MED ORDER — ACETAMINOPHEN 325 MG PO TABS
650.0000 mg | ORAL_TABLET | Freq: Four times a day (QID) | ORAL | Status: DC | PRN
  Administered 2015-07-25: 650 mg via ORAL
  Filled 2015-07-25: qty 2

## 2015-07-25 MED ORDER — SODIUM CHLORIDE 0.9 % IV SOLN
1.5000 g | Freq: Four times a day (QID) | INTRAVENOUS | Status: DC
  Administered 2015-07-25 – 2015-07-26 (×4): 1.5 g via INTRAVENOUS
  Filled 2015-07-25 (×5): qty 1.5

## 2015-07-25 MED ORDER — OXYCODONE HCL 5 MG PO TABS
5.0000 mg | ORAL_TABLET | ORAL | Status: DC | PRN
  Administered 2015-07-25 – 2015-07-26 (×4): 5 mg via ORAL
  Filled 2015-07-25 (×4): qty 1

## 2015-07-25 MED ORDER — ONDANSETRON HCL 4 MG/2ML IJ SOLN: 4.0000 mg | Freq: Four times a day (QID) | INTRAMUSCULAR | Status: DC | PRN

## 2015-07-25 MED ORDER — CLINDAMYCIN PHOSPHATE 300 MG/50ML IV SOLN
300.0000 mg | Freq: Once | INTRAVENOUS | Status: AC
  Administered 2015-07-25: 300 mg via INTRAVENOUS
  Filled 2015-07-25: qty 50

## 2015-07-25 MED ORDER — MORPHINE SULFATE (PF) 4 MG/ML IV SOLN
4.0000 mg | Freq: Once | INTRAVENOUS | Status: AC
  Administered 2015-07-25: 4 mg via INTRAVENOUS
  Filled 2015-07-25: qty 1

## 2015-07-25 MED ORDER — ONDANSETRON HCL 4 MG/2ML IJ SOLN
4.0000 mg | Freq: Once | INTRAMUSCULAR | Status: AC
  Administered 2015-07-25: 4 mg via INTRAVENOUS
  Filled 2015-07-25: qty 2

## 2015-07-25 MED ORDER — ACETAMINOPHEN 650 MG RE SUPP: 650.0000 mg | Freq: Four times a day (QID) | RECTAL | Status: DC | PRN

## 2015-07-25 MED ORDER — SODIUM CHLORIDE 0.9 % IV BOLUS (SEPSIS)
500.0000 mL | Freq: Once | INTRAVENOUS | Status: AC
  Administered 2015-07-25: 500 mL via INTRAVENOUS

## 2015-07-25 MED ORDER — ONDANSETRON HCL 4 MG PO TABS: 4.0000 mg | ORAL_TABLET | Freq: Four times a day (QID) | ORAL | Status: DC | PRN

## 2015-07-25 MED ORDER — DIPHENHYDRAMINE HCL 25 MG PO CAPS
25.0000 mg | ORAL_CAPSULE | Freq: Once | ORAL | Status: AC
  Administered 2015-07-25: 25 mg via ORAL
  Filled 2015-07-25: qty 1

## 2015-07-25 MED ORDER — CETYLPYRIDINIUM CHLORIDE 0.05 % MT LIQD: 7.0000 mL | Freq: Two times a day (BID) | OROMUCOSAL | Status: DC

## 2015-07-25 MED ORDER — IOPAMIDOL (ISOVUE-300) INJECTION 61%
75.0000 mL | Freq: Once | INTRAVENOUS | Status: AC | PRN
  Administered 2015-07-25: 75 mL via INTRAVENOUS

## 2015-07-25 NOTE — H&P (Signed)
History and Physical    Benjamin Chandler NWG:[id number] DOB: 11-20-1980 DOA: 07/25/2015  Referring MD/NP/PA:  PCP: No PCP Per Patient  Outpatient Specialists:  Patient coming from: Home  Chief Complaint: Right facial swelling  HPI: Benjamin Chandler is a 35 y.o. male with a past medical history presenting to the emergency room with complaints of worsening right-sided facial swelling associate with erythema. Was seen in the emergency room on 07/10/2015 at which time he presented with complaints of right-sided dental pain, found to have a superficial dental abscess for which he was given a dental nerve block and drainage of abscess. He was discharged on oral clindamycin. He states completing his antibiotic course however over the past week has developed worsening right-sided pain, swelling and erythema. He denies fevers, chills, difficulty breathing, nausea, vomiting, chest pain, shortness of breath, able take by mouth.  ED Course: In the emergency department had a maxillofacial CT scan that showed periapical lucency surrounding the right third mandibular molar consistent with infection. Adjacent to the area was 14 x 8 mm fluid collection reflecting small abscess. In the emergency room he did not have evidence of lugwig's angina.   Review of Systems: As per HPI otherwise 10 point review of systems negative.   History reviewed. No pertinent past medical history.  History reviewed. No pertinent past surgical history.   reports that he has been smoking Cigarettes.  He has a 5 pack-year smoking history. He has never used smokeless tobacco. He reports that he drinks alcohol. He reports that he does not use illicit drugs.  No Known Allergies  Family History  Problem Relation Age of Onset  . Cancer Father    Family history reviewed, not pertinent  Prior to Admission medications   Medication Sig Start Date End Date Taking? Authorizing Provider  ibuprofen (ADVIL,MOTRIN) 200 MG tablet Take 400 mg by  mouth every 6 (six) hours as needed for fever, headache, mild pain, moderate pain or cramping.   Yes Historical Provider, MD  clindamycin (CLEOCIN) 150 MG capsule Take 2 capsules (300 mg total) by mouth 4 (four) times daily. Patient not taking: Reported on 07/25/2015 07/10/15   [facility] PcJaime Pilcher Ward, PA-C  diazepam (VALIUM) 5 MG tablet Take 1 tablet (5 mg total) by mouth every 8 (eight) hours as needed for anxiety. Patient not taking: Reported on 05/07/2014 12/05/13   Earley FavorGail Schulz, NP  diazepam (VALIUM) 5 MG tablet Take 1 tablet (5 mg total) by mouth every 8 (eight) hours as needed for anxiety. Patient not taking: Reported on 07/25/2015 12/05/13   Earley FavorGail Schulz, NP    Physical Exam: Filed Vitals:   07/25/15 1019 07/25/15 1514  BP: 132/91 114/66  Pulse: 82 58  Temp: 98.9 F (37.2 C)   TempSrc: Oral   Resp: 16 16  SpO2: 99% 98%      Constitutional: NAD, calm, comfortable, sitting comfortably, in no acute distress, breathing comfortably no drooling noted. Filed Vitals:   07/25/15 1019 07/25/15 1514  BP: 132/91 114/66  Pulse: 82 58  Temp: 98.9 F (37.2 C)   TempSrc: Oral   Resp: 16 16  SpO2: 99% 98%   Eyes: PERRL, lids and conjunctivae normal ENMT: Mucous membranes are moist. Posterior pharynx clear of any exudate or lesions.unable to open his mouth fully due to pain. I did not note time swelling or protrusion.  Neck: normal, supple, no masses, no thyromegaly Respiratory: clear to auscultation bilaterally, no wheezing, no crackles. Normal respiratory effort. No accessory muscle use.  Cardiovascular: Regular rate  and rhythm, no murmurs / rubs / gallops. No extremity edema. 2+ pedal pulses. No carotid bruits.  Abdomen: no tenderness, no masses palpated. No hepatosplenomegaly. Bowel sounds positive.  Musculoskeletal: no clubbing / cyanosis. No joint deformity upper and lower extremities. Good ROM, no contractures. Normal muscle tone.  Skin: There is swelling of right facial area with associated  erythema. Neurologic: CN 2-12 grossly intact. Sensation intact, DTR normal. Strength 5/5 in all 4.  Psychiatric: Normal judgment and insight. Alert and oriented x 3. Normal mood.   Labs on Admission: I have personally reviewed following labs and imaging studies  CBC:  Recent Labs Lab 07/25/15 1215  WBC 8.9  NEUTROABS 6.2  HGB 15.6  HCT 44.9  MCV 87.9  PLT 229   Basic Metabolic Panel:  Recent Labs Lab 07/25/15 1215  NA 137  K 4.4  CL 105  CO2 25  GLUCOSE 98  BUN 11  CREATININE 0.80  CALCIUM 9.6   GFR: CrCl cannot be calculated (Unknown ideal weight.). Liver Function Tests: No results for input(s): AST, ALT, ALKPHOS, BILITOT, PROT, ALBUMIN in the last 168 hours. No results for input(s): LIPASE, AMYLASE in the last 168 hours. No results for input(s): AMMONIA in the last 168 hours. Coagulation Profile: No results for input(s): INR, PROTIME in the last 168 hours. Cardiac Enzymes: No results for input(s): CKTOTAL, CKMB, CKMBINDEX, TROPONINI in the last 168 hours. BNP (last 3 results) No results for input(s): PROBNP in the last 8760 hours. HbA1C: No results for input(s): HGBA1C in the last 72 hours. CBG: No results for input(s): GLUCAP in the last 168 hours. Lipid Profile: No results for input(s): CHOL, HDL, LDLCALC, TRIG, CHOLHDL, LDLDIRECT in the last 72 hours. Thyroid Function Tests: No results for input(s): TSH, T4TOTAL, FREET4, T3FREE, THYROIDAB in the last 72 hours. Anemia Panel: No results for input(s): VITAMINB12, FOLATE, FERRITIN, TIBC, IRON, RETICCTPCT in the last 72 hours. Urine analysis: No results found for: COLORURINE, APPEARANCEUR, LABSPEC, PHURINE, GLUCOSEU, HGBUR, BILIRUBINUR, KETONESUR, PROTEINUR, UROBILINOGEN, NITRITE, LEUKOCYTESUR Sepsis Labs: (procalcitonin:4,lacticidven:4) )No results found for this or any previous visit (from the past 240 hour(s)).   Radiological Exams on Admission: Ct Maxillofacial W/cm  07/25/2015  CLINICAL  DATA:  Seen previously for dental pain and facial swelling. Patient states he was given antibiotics and the swelling decreased. Patient started having right facial swelling again 4 days ago and increased right dental pain. EXAM: CT MAXILLOFACIAL WITH CONTRAST TECHNIQUE: Multidetector CT imaging of the maxillofacial structures was performed with intravenous contrast. Multiplanar CT image reconstructions were also generated. A small metallic BB was placed on the right temple in order to reliably differentiate right from left. CONTRAST:  75mL ISOVUE-300 IOPAMIDOL (ISOVUE-300) INJECTION 61% COMPARISON:  None. FINDINGS: There is periapical lucency surrounding the right third mandibular molar with cortical destruction along the lateral margin. Adjacent to the area of cortical disruption there is a 14 x 8 mm fluid collection with mild expansion of the right masticator muscle. The globes are intact. The orbital walls are intact. The orbital floors are intact. The maxilla is intact. The mandible is ohterwise intact. The zygomatic arches are intact. The nasal septum is midline. There is no nasal bone fracture. The temporomandibular joints are normal. The paranasal sinuses are clear. The visualized portions of the mastoid sinuses are well aerated. IMPRESSION: 1. Periapical lucency surrounding the right third mandibular molar with cortical destruction along the lateral margin most consistent with infection. Adjacent to the area of cortical destruction there is a 14 x  8 mm fluid collection likely reflecting a small abscess with mild expansion of the right masticator muscle likely secondary to inflammation. Electronically Signed   By: Elige Ko   On: 07/25/2015 14:48    EKG: Independently reviewed.   Assessment/Plan Principal Problem:   Facial cellulitis Active Problems:   Dental abscess   1.  Dental abscess. Mr Mazon is a pleasant 35 year old gentleman with no significant past medical history presenting with  right facial swelling and pain. He was seen in the emergency department on 07/10/2015 at which time he presented with dental pain, diagnosed with dental abscess undergoing drainage and sent home on clindamycin. He states completing course of clindamycin with the past week having increasing pain and swelling involving his right facial region. A CT scan performed in the emergency room revealed small abscess near third right mandibular molar having mild expansion of the right masticator muscle likely reflecting inflammation. He does not have evidence of Ludwigs Angina, he was breathing comfortably without tying swelling, drooling or neck pain. Will place him on empiric IV antibiotic therapy with Unasyn 1.5 mg IV every 8 hours. Monitor overnight, anticipate discharge in the next 24-48 hours if he remains stable   DVT prophylaxis: Lovenox Code Status: Full code Family Communication: Family not present Disposition Plan: Anticipate discharge in the next 24-48 hours Consults called:  Admission status: Will admit to [facility] Dan MD [facility] Pager [phone]  If 7PM-7AM, please contact night-coverage [email or web address] Password [email or web address]  07/25/2015, 5:10 PM

## 2015-07-25 NOTE — ED Notes (Signed)
Bed: WHALD Expected date:  Expected time:  Means of arrival:  Comments: 

## 2015-07-25 NOTE — ED Notes (Signed)
fPatient states he was seen previously for dental pain and facial swelling. Patient states he was given antibiotics and the swelling decreased. Patient started having right facial swelling again 4 days ago and increased right dental pain.

## 2015-07-25 NOTE — ED Provider Notes (Signed)
CSN: [id number]     Arrival date & time 07/25/15  1005 History   First MD Initiated Contact with Patient 07/25/15 1129     Chief Complaint  Patient presents with  . Facial Swelling  . Dental Pain     (Consider location/radiation/quality/duration/timing/severity/associated sxs/prior Treatment) HPI...Marland Kitchen.Marland Kitchen.Right facial swelling for several days. Patient was seen on 07/10/2015 with a periapical abscess. Incision and drainage was performed and clindamycin was started. He has been taking atb's prescribed. No fever sweats or chills. Swelling is increased. No meningeal signs History reviewed. No pertinent past medical history. History reviewed. No pertinent past surgical history. Family History  Problem Relation Age of Onset  . Cancer Father    Social History  Substance Use Topics  . Smoking status: Current Every Day Smoker -- 1.00 packs/day for 5 years    Types: Cigarettes  . Smokeless tobacco: Never Used  . Alcohol Use: Yes     Comment: social    Review of Systems  All other systems reviewed and are negative.     Allergies  Review of patient's allergies indicates no known allergies.  Home Medications   Prior to Admission medications   Medication Sig Start Date End Date Taking? Authorizing Provider  ibuprofen (ADVIL,MOTRIN) 200 MG tablet Take 400 mg by mouth every 6 (six) hours as needed for fever, headache, mild pain, moderate pain or cramping.   Yes Historical Provider, MD  clindamycin (CLEOCIN) 150 MG capsule Take 2 capsules (300 mg total) by mouth 4 (four) times daily. Patient not taking: Reported on 07/25/2015 07/10/15   [facility] Center[facility]ime Pil[facility] Ward, PA-C  diazepam (VALIUM) 5 MG tablet Take 1 tablet (5 mg total) by mouth every 8 (eight) hours as needed for anxiety. Patient not taking: Reported on 05/07/2014 12/05/13   Earley FavorGail Schulz, NP  diazepam (VALIUM) 5 MG tablet Take 1 tablet (5 mg total) by mouth every 8 (eight) hours as needed for anxiety. Patient not taking: Reported on 07/25/2015 12/05/13    Earley FavorGail Schulz, NP   BP 114/66 mmHg  Pulse 58  Temp(Src) 98.9 F (37.2 C) (Oral)  Resp 16  SpO2 98% Physical Exam  Constitutional: He is oriented to person, place, and time. He appears well-developed and well-nourished.  HENT:  Noticeable swelling right cheek with tenderness  Eyes: Conjunctivae and EOM are normal. Pupils are equal, round, and reactive to light.  Neck: Normal range of motion. Neck supple.  Cardiovascular: Normal rate and regular rhythm.   Pulmonary/Chest: Effort normal and breath sounds normal.  Abdominal: Soft. Bowel sounds are normal.  Musculoskeletal: Normal range of motion.  Neurological: He is alert and oriented to person, place, and time.  Skin: Skin is warm and dry.  Psychiatric: He has a normal mood and affect. His behavior is normal.  Nursing note and vitals reviewed.   ED Course  Procedures (including critical care time) Labs Review Labs Reviewed  CBC WITH DIFFERENTIAL/PLATELET  BASIC METABOLIC PANEL    Imaging Review Ct Maxillofacial W/cm  07/25/2015  CLINICAL DATA:  Seen previously for dental pain and facial swelling. Patient states he was given antibiotics and the swelling decreased. Patient started having right facial swelling again 4 days ago and increased right dental pain. EXAM: CT MAXILLOFACIAL WITH CONTRAST TECHNIQUE: Multidetector CT imaging of the maxillofacial structures was performed with intravenous contrast. Multiplanar CT image reconstructions were also generated. A small metallic BB was placed on the right temple in order to reliably differentiate right from left. CONTRAST:  75mL ISOVUE-300 IOPAMIDOL (ISOVUE-300) INJECTION 61% COMPARISON:  None. FINDINGS: There is periapical lucency surrounding the right third mandibular molar with cortical destruction along the lateral margin. Adjacent to the area of cortical disruption there is a 14 x 8 mm fluid collection with mild expansion of the right masticator muscle. The globes are intact. The  orbital walls are intact. The orbital floors are intact. The maxilla is intact. The mandible is ohterwise intact. The zygomatic arches are intact. The nasal septum is midline. There is no nasal bone fracture. The temporomandibular joints are normal. The paranasal sinuses are clear. The visualized portions of the mastoid sinuses are well aerated. IMPRESSION: 1. Periapical lucency surrounding the right third mandibular molar with cortical destruction along the lateral margin most consistent with infection. Adjacent to the area of cortical destruction there is a 14 x 8 mm fluid collection likely reflecting a small abscess with mild expansion of the right masticator muscle likely secondary to inflammation. Electronically Signed   By: Elige Ko   On: 07/25/2015 14:48   I have personally reviewed and evaluated these images and lab results as part of my medical decision-making.   EKG Interpretation None      MDM   Final diagnoses:  Facial abscess    CT scan reveals a 14 x 8 mm fluid collection most likely reflecting small abscess. IV clindamycin. Admit to general medicine.     Donnetta Hutching, MD 07/25/15 [phone]

## 2015-07-26 DIAGNOSIS — L03211 Cellulitis of face: Secondary | ICD-10-CM

## 2015-07-26 LAB — BASIC METABOLIC PANEL
Anion gap: 12 (ref 5–15)
BUN: 13 mg/dL (ref 6–20)
CALCIUM: 9.4 mg/dL (ref 8.9–10.3)
CHLORIDE: 105 mmol/L (ref 101–111)
CO2: 24 mmol/L (ref 22–32)
CREATININE: 0.94 mg/dL (ref 0.61–1.24)
GFR calc Af Amer: >60 mL/min (ref >60)
Glucose, Bld: 108 mg/dL — ABNORMAL HIGH (ref 65–99)
Potassium: 4 mmol/L (ref 3.5–5.1)
SODIUM: 141 mmol/L (ref 135–145)

## 2015-07-26 LAB — CBC
HCT: 41.3 % (ref 39.0–52.0)
Hemoglobin: 14.4 g/dL (ref 13.0–17.0)
MCH: 29.9 pg (ref 26.0–34.0)
MCHC: 34.9 g/dL (ref 30.0–36.0)
MCV: 85.9 fL (ref 78.0–100.0)
PLATELETS: 226 10*3/uL (ref 150–400)
RBC: 4.81 MIL/uL (ref 4.22–5.81)
RDW: 11.9 % (ref 11.5–15.5)
WBC: 7.3 10*3/uL (ref 4.0–10.5)

## 2015-07-26 MED ORDER — IBUPROFEN 200 MG PO TABS: 800.0000 mg | ORAL_TABLET | Freq: Three times a day (TID) | ORAL | Status: DC

## 2015-07-26 MED ORDER — AMOXICILLIN-POT CLAVULANATE 875-125 MG PO TABS: 1.0000 | ORAL_TABLET | Freq: Two times a day (BID) | ORAL | Status: DC

## 2015-07-26 NOTE — Care Management Note (Signed)
Case Management Note  Patient Details  Name: Oralia RudWilliam Sarff MRN: [id number] Date of Birth: 1980-04-11  Subjective/Objective:   periapical lucency surrounding the right third mandibular molar consistent with infection                 Action/Plan: Discharge Planning:  NCM spoke to pt and states he currently does not have any insurance or dental insurance. He plans to follow up with a family that works in a dentist office to arrange appointment. Pt states he does work and can afford his medications if not too expensive. His last abx when he dc from ED was $21.00. Will continue to follow for any dc needs.    Expected Discharge Date:  07/27/2015            Expected Discharge Plan:  Home/Self Care  In-House Referral:  NA  Discharge planning Services  CM Consult  Post Acute Care Choice:  NA Choice offered to:  NA  DME Arranged:  N/A DME Agency:  NA  HH Arranged:  NA HH Agency:  NA  Status of Service:  Completed, signed off  Medicare Important Message Given:    Date Medicare IM Given:    Medicare IM give by:    Date Additional Medicare IM Given:    Additional Medicare Important Message give by:     If discussed at Long Length of Stay Meetings, dates discussed:    Additional Comments:  Elliot CousinShavis, Aries Kasa Ellen, RN 07/26/2015, 1:48 PM

## 2015-07-26 NOTE — Progress Notes (Signed)
Patient discharged to home, discharge instructions reviewed with patient who verbalized understanding. Patient requested to walk down on his own.

## 2015-07-26 NOTE — Discharge Summary (Signed)
Physician Discharge Summary  Benjamin Chandler [id number] DOB: 06/07/1980 DOA: 07/25/2015  [facility] [address]  Admit date: 07/25/2015 Discharge date: 07/26/2015  Time spent: 15 minutes  Recommendations for Outpatient Follow-up:  1. See dentist 2. Get xtractions 3. Needs smoking cessation guidance a sOP 4. Need sPCP  Discharge Diagnoses:  Principal Problem:   Facial cellulitis Active Problems:   Dental abscess   Discharge Condition: improved  Diet recommendation: reg  Filed Weights   07/25/15 1716  Weight: 92.987 kg (205 lb)    History of present illness:  35 y/o ? worsening right-sided facial swelling associate with erythema. Was seen in the emergency room on 07/10/2015 at which time he presented with complaints of right-sided dental pain, found to have a superficial dental abscess for which he was given a dental nerve block and drainage of abscess. 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] Stable for d/c home 4.27 as swelling/pain subsided    Discharge Exam: Filed Vitals:   07/26/15 0635 07/26/15 1402  BP: 114/68 126/66  Pulse: 58 65  Temp: 97.3 F (36.3 C) 98 F (36.7 C)  Resp: 16 16    General: eomi ncat Swelling to face [per patient] much improved Cardiovascular:  s1 s 2no m/r/g Respiratory: clear  Discharge Instructions   Discharge Instructions    Diet - low sodium heart healthy    Complete by:  As directed      Discharge instructions    Complete by:  As directed   See a dentists ASAP-you wll need Xrays and extraction probably Stop smoking-the teeth will not heal if you do Take Augmentin until instructed otherwise by dentist Take care and good luck     Increase activity slowly    Complete by:  As directed           Current Discharge Medication List    START taking  these medications   Details  amoxicillin-clavulanate (AUGMENTIN) 875-125 MG tablet Take 1 tablet by mouth every 12 (twelve) hours. Qty: 40 tablet, Refills: 0      CONTINUE these medications which have CHANGED   Details  ibuprofen (ADVIL,MOTRIN) 200 MG tablet Take 4 tablets (800 mg total) by mouth 3 (three) times daily. Qty: 30 tablet, Refills: 0      STOP taking these medications     clindamycin (CLEOCIN) 150 MG capsule      diazepam (VALIUM) 5 MG tablet      diazepam (VALIUM) 5 MG tablet        No Known Allergies    The results of significant diagnostics from this hospitalization (including imaging, microbiology, ancillary and laboratory) are listed below for reference.    Significant Diagnostic Studies: Ct Maxillofacial W/cm  07/25/2015  CLINICAL DATA:  Seen previously for dental pain and facial swelling. Patient states he was given antibiotics and the swelling decreased. Patient started having right facial swelling again 4 days ago and increased right dental pain. EXAM: CT MAXILLOFACIAL WITH CONTRAST TECHNIQUE: Multidetector CT imaging of the maxillofacial structures was performed with intravenous contrast. Multiplanar CT image reconstructions were also generated. A small metallic BB was placed on the right temple in order to reliably differentiate right from left. CONTRAST:  75mL ISOVUE-300 IOPAMIDOL (ISOVUE-300) INJECTION 61% COMPARISON:  None. FINDINGS: There is periapical lucency surrounding the right third mandibular molar with cortical destruction along the  lateral margin. Adjacent to the area of cortical disruption there is a 14 x 8 mm fluid collection with mild expansion of the right masticator muscle. The globes are intact. The orbital walls are intact. The orbital floors are intact. The maxilla is intact. The mandible is ohterwise intact. The zygomatic arches are intact. The nasal septum is midline. There is no nasal bone fracture. The temporomandibular joints are normal.  The paranasal sinuses are clear. The visualized portions of the mastoid sinuses are well aerated. IMPRESSION: 1. Periapical lucency surrounding the right third mandibular molar with cortical destruction along the lateral margin most consistent with infection. Adjacent to the area of cortical destruction there is a 14 x 8 mm fluid collection likely reflecting a small abscess with mild expansion of the right masticator muscle likely secondary to inflammation. Electronically Signed   By: Elige Ko   On: 07/25/2015 14:48    Microbiology: No results found for this or any previous visit (from the past 240 hour(s)).   Labs: Basic Metabolic Panel:  Recent Labs Lab 07/25/15 1215 07/26/15 0331  NA 137 141  K 4.4 4.0  CL 105 105  CO2 25 24  GLUCOSE 98 108*  BUN 11 13  CREATININE 0.80 0.94  CALCIUM 9.6 9.4   Liver Function Tests: No results for input(s): AST, ALT, ALKPHOS, BILITOT, PROT, ALBUMIN in the last 168 hours. No results for input(s): LIPASE, AMYLASE in the last 168 hours. No results for input(s): AMMONIA in the last 168 hours. CBC:  Recent Labs Lab 07/25/15 1215 07/26/15 0331  WBC 8.9 7.3  NEUTROABS 6.2  --   HGB 15.6 14.4  HCT 44.9 41.3  MCV 87.9 85.9  PLT 229 226   Cardiac Enzymes: No results for input(s): CKTOTAL, CKMB, CKMBINDEX, TROPONINI in the last 168 hours. BNP: BNP (last 3 results) No results for input(s): BNP in the last 8760 hours.  ProBNP (last 3 results) No results for input(s): PROBNP in the last 8760 hours.  CBG: No results for input(s): GLUCAP in the last 168 hours.     SignedRhetta Mura MD   [facility] 07/26/2015, 2:21 PM

## 2015-11-20 ENCOUNTER — Emergency Department (HOSPITAL_COMMUNITY): Payer: Self-pay

## 2015-11-20 ENCOUNTER — Emergency Department (HOSPITAL_COMMUNITY)
Admission: EM | Admit: 2015-11-20 | Discharge: 2015-11-20 | Disposition: A | Discharge status: Home / Self Care | Payer: Self-pay | Attending: Emergency Medicine | Admitting: Emergency Medicine

## 2015-11-20 ENCOUNTER — Encounter (HOSPITAL_COMMUNITY): Payer: Self-pay | Admitting: Emergency Medicine

## 2015-11-20 DIAGNOSIS — R109 Unspecified abdominal pain: Secondary | ICD-10-CM

## 2015-11-20 DIAGNOSIS — R350 Frequency of micturition: Secondary | ICD-10-CM | POA: Insufficient documentation

## 2015-11-20 DIAGNOSIS — F17210 Nicotine dependence, cigarettes, uncomplicated: Secondary | ICD-10-CM | POA: Insufficient documentation

## 2015-11-20 LAB — URINALYSIS, ROUTINE W REFLEX MICROSCOPIC
BILIRUBIN URINE: NEGATIVE
Glucose, UA: NEGATIVE mg/dL
HGB URINE DIPSTICK: NEGATIVE
KETONES UR: NEGATIVE mg/dL
Leukocytes, UA: NEGATIVE
Nitrite: NEGATIVE
PH: 5.5 (ref 5.0–8.0)
Protein, ur: NEGATIVE mg/dL
SPECIFIC GRAVITY, URINE: 1.028 (ref 1.005–1.030)

## 2015-11-20 LAB — BASIC METABOLIC PANEL
Anion gap: 5 (ref 5–15)
BUN: 12 mg/dL (ref 6–20)
CO2: 25 mmol/L (ref 22–32)
Calcium: 9.5 mg/dL (ref 8.9–10.3)
Chloride: 105 mmol/L (ref 101–111)
Creatinine, Ser: 0.9 mg/dL (ref 0.61–1.24)
GFR calc Af Amer: >60 mL/min (ref >60)
GFR calc non Af Amer: >60 mL/min (ref >60)
Glucose, Bld: 91 mg/dL (ref 65–99)
Potassium: 4.1 mmol/L (ref 3.5–5.1)
Sodium: 135 mmol/L (ref 135–145)

## 2015-11-20 LAB — CBC WITH DIFFERENTIAL/PLATELET
Basophils Absolute: 0 10*3/uL (ref 0.0–0.1)
Basophils Relative: 0 %
Eosinophils Absolute: 0.2 10*3/uL (ref 0.0–0.7)
Eosinophils Relative: 3 %
HCT: 42.9 % (ref 39.0–52.0)
Hemoglobin: 14.4 g/dL (ref 13.0–17.0)
Lymphocytes Relative: 23 %
Lymphs Abs: 1.9 10*3/uL (ref 0.7–4.0)
MCH: 29.8 pg (ref 26.0–34.0)
MCHC: 33.6 g/dL (ref 30.0–36.0)
MCV: 88.8 fL (ref 78.0–100.0)
Monocytes Absolute: 0.7 10*3/uL (ref 0.1–1.0)
Monocytes Relative: 9 %
Neutro Abs: 5.4 10*3/uL (ref 1.7–7.7)
Neutrophils Relative %: 65 %
Platelets: 205 10*3/uL (ref 150–400)
RBC: 4.83 MIL/uL (ref 4.22–5.81)
RDW: 12.2 % (ref 11.5–15.5)
WBC: 8.2 10*3/uL (ref 4.0–10.5)

## 2015-11-20 MED ORDER — KETOROLAC TROMETHAMINE 30 MG/ML IJ SOLN
30.0000 mg | Freq: Once | INTRAMUSCULAR | Status: AC
  Administered 2015-11-20: 30 mg via INTRAMUSCULAR

## 2015-11-20 MED ORDER — KETOROLAC TROMETHAMINE 30 MG/ML IJ SOLN
30.0000 mg | Freq: Once | INTRAMUSCULAR | Status: DC
  Filled 2015-11-20: qty 1

## 2015-11-20 NOTE — ED Notes (Signed)
PA at bedside.

## 2015-11-20 NOTE — Discharge Instructions (Signed)
Treatment: Take ibuprofen every 4-6 hours as prescribed over-the-counter for your pain. Use moist heat and ice alternating 20 minutes on, 20 minutes off. You will be called in 2-3 days if your STD cultures return positive. If they are positive, please see your doctor or go to the health department for treatment. If positive, please make your sexual partners aware that they will need to be treated as well.  Follow-up: Please follow-up with your primary care provider this week for follow-up of today's visit and further evaluation and treatment of your symptoms. Please follow-up with Dr. Berneice HeinrichManny, a urologist, as needed or as directed by your primary care provider for further evaluation and treatment of your symptoms. Please return to the emergency department if you develop any new or worsening symptoms.

## 2015-11-20 NOTE — ED Triage Notes (Signed)
Pt sts left sided flank pain and burning with urination and some difficulty urinating starting yesterday

## 2015-11-20 NOTE — ED Notes (Signed)
Patient transported to CT 

## 2015-11-20 NOTE — ED Provider Notes (Signed)
[facility]-EMERGENCY DEPT Provider Note   CSN: [id number] Arrival date & time: 11/20/15  1007     History   Chief Complaint Chief Complaint  Patient presents with  . Flank Pain    HPI Benjamin Chandler is a 35 y.o. male who presents with a one-day history of left flank pain radiating to his left lower quadrant and left leg. Patient describes his pain as a sharp pain. Patient states it is very difficult to get comfortable. Patient has woken up in the middle the night sweating and with a rash "like hives" to his bilateral flanks. He states he only has the rash at night and resolves. Patient has had associated urinary frequency and a "white urine." Patient denies any penile discharge. Patient states he had similar symptoms 2 weeks ago, however they resolved after drinking cranberry juice. Patient denies any fevers, chest pain, shortness of breath, nausea, vomiting, other abdominal pain, dysuria. Patient has no concern for STD exposure.  HPI  History reviewed. No pertinent past medical history.  Patient Active Problem List   Diagnosis Date Noted  . Facial abscess 07/25/2015  . Facial cellulitis 07/25/2015  . Dental abscess 07/25/2015    History reviewed. No pertinent surgical history.     Home Medications    Prior to Admission medications   Medication Sig Start Date End Date Taking? Authorizing Provider  amoxicillin-clavulanate (AUGMENTIN) 875-125 MG tablet Take 1 tablet by mouth every 12 (twelve) hours. 07/27/15   Rhetta MuraJai-Gurmukh Samtani, MD  ibuprofen (ADVIL,MOTRIN) 200 MG tablet Take 4 tablets (800 mg total) by mouth 3 (three) times daily. 07/26/15   Rhetta MuraJai-Gurmukh Samtani, MD    Family History Family History  Problem Relation Age of Onset  . Cancer Father     Social History Social History  Substance Use Topics  . Smoking status: Current Every Day Smoker    Packs/day: 1.00    Years: 5.00    Types: Cigarettes  . Smokeless tobacco: Never Used  . Alcohol use Yes     Comment: social      Allergies   Review of patient's allergies indicates no known allergies.   Review of Systems Review of Systems  Constitutional: Positive for diaphoresis (only at night). Negative for chills and fever.  HENT: Negative for facial swelling and sore throat.   Respiratory: Negative for shortness of breath.   Cardiovascular: Negative for chest pain.  Gastrointestinal: Positive for abdominal pain (LLQ). Negative for nausea and vomiting.  Genitourinary: Positive for flank pain and frequency. Negative for discharge and dysuria.  Musculoskeletal: Negative for back pain.  Skin: Positive for rash (only at night). Negative for wound.  Neurological: Negative for headaches.  Psychiatric/Behavioral: The patient is not nervous/anxious.      Physical Exam Updated Vital Signs BP 113/78 (BP Location: Left Arm)   Pulse 65   Temp 98 F (36.7 C) (Oral)   Resp 18   SpO2 100%   Physical Exam  Constitutional: He appears well-developed and well-nourished. No distress.  HENT:  Head: Normocephalic and atraumatic.  Mouth/Throat: Oropharynx is clear and moist. No oropharyngeal exudate.  Eyes: Conjunctivae are normal. Pupils are equal, round, and reactive to light. Right eye exhibits no discharge. Left eye exhibits no discharge. No scleral icterus.  Neck: Normal range of motion. Neck supple. No thyromegaly present.  Cardiovascular: Normal rate, regular rhythm, normal heart sounds and intact distal pulses.  Exam reveals no gallop and no friction rub.   No murmur heard. Pulmonary/Chest: Effort normal and breath sounds normal. No  stridor. No respiratory distress. He has no wheezes. He has no rales.  Abdominal: Soft. Bowel sounds are normal. He exhibits no distension. There is tenderness in the left lower quadrant. There is CVA tenderness (L and L lumbar/flank tenderness). There is no rebound, no guarding, no tenderness at McBurney's point and negative Murphy's sign.  Musculoskeletal: He exhibits no edema.        Lumbar back: He exhibits tenderness. He exhibits no bony tenderness.       Back:  Lymphadenopathy:    He has no cervical adenopathy.  Neurological: He is alert. Coordination normal.  Skin: Skin is warm and dry. No rash noted. He is not diaphoretic. No pallor.  No rashes noted to bilateral flanks   Psychiatric: He has a normal mood and affect.  Nursing note and vitals reviewed.    ED Treatments / Results  Labs (all labs ordered are listed, but only abnormal results are displayed) Labs Reviewed  URINALYSIS, ROUTINE W REFLEX MICROSCOPIC (NOT AT [facility])  BASIC METABOLIC PANEL  CBC WITH DIFFERENTIAL/PLATELET  GC/CHLAMYDIA PROBE AMP ([place]) [facility]    EKG  EKG Interpretation None       Radiology Ct Renal Stone Study  Result Date: 11/20/2015 CLINICAL DATA:  Left flank pain radiating to the groin. Urinary frequency. EXAM: CT ABDOMEN AND PELVIS WITHOUT CONTRAST TECHNIQUE: Multidetector CT imaging of the abdomen and pelvis was performed following the standard protocol without IV contrast. COMPARISON:  None. FINDINGS: Lower chest:  No pulmonary nodule or pleural effusion. Hepatobiliary: Normal unenhanced appearance of the liver. No biliary dilatation. Gallbladder is normal. Pancreas: Pancreatic contours are normal. No abnormal calcifications or mass lesions. No peripancreatic fluid collection. No pancreatic ductal dilatation. Spleen: Normal unenhanced appearance of the spleen. Adrenals/Urinary Tract: The adrenal glands are normal. Low-attenuation focus at the lower pole of the right kidney measures less than 1 cm and is too small to characterize accurately but likely represents a renal cyst. There is no nephrolithiasis identified. No perinephric stranding. No hydronephrosis. There are no ureteral stones. The course and caliber of both ureters is normal. The urinary bladder is grossly unremarkable. Stomach/Bowel: No dilated small bowel or other evidence of obstruction. No enteric  or colonic inflammation. The appendix is normal caliber. No fluid collection within the abdomen. Vascular/Lymphatic: The course and caliber of the major abdominal vessels are normal. There is no abdominal or pelvic lymphadenopathy. Reproductive: The prostate and seminal vesicles are normal. No free fluid in the pelvis. Other: The visualized extraperitoneal and extrathoracic soft tissues are normal. Musculoskeletal: Normal IMPRESSION: 1. No evidence of obstructive uropathy. 2. No nephrolithiasis. 3. Normal unenhanced CT of the abdomen and pelvis. Electronically Signed   By: Deatra RobinsonKevin  Herman M.D.   On: 11/20/2015 13:34    Procedures Procedures (including critical care time)  Medications Ordered in ED Medications  ketorolac (TORADOL) 30 MG/ML injection 30 mg (30 mg Intramuscular Given 11/20/15 1329)     Initial Impression / Assessment and Plan / ED Course  I have reviewed the triage vital signs and the nursing notes.  Pertinent labs & imaging results that were available during my care of the patient were reviewed by me and considered in my medical decision making (see chart for details).  Clinical Course    12:55pm Patient requesting pain medication now after declining initially; will order Toradol. Pain improved with Toradol.  CBC, BMP, UA negative. CT renal stone study shows no evidence of obstructive uropathy, no nephrolithiasis; normal unenhanced CT. Patient does state  that he was in a crawl space past couple days and was in a contorted position and may be he strained muscle. Patient has no concern for STD exposure, however GC/chlamydia urine sent. Patient advised that he will be called in 2-3 days if any of his results return positive. He was advised to follow-up with his PCP or the health department for treatment if any of his results are positive; also advised to make an sexual partners aware of any positive results and need for treatment. Patient vitals take ibuprofen for his pain and  follow-up to PCP in the next 2-3 days. Patient also given urology follow-up as needed. Patient understands and agrees with plan. Patient vitals stable throughout ED course and discharged in satisfactory condition. I discussed patient case with Dr. Clarene Duke who agrees with plan.  Final Clinical Impressions(s) / ED Diagnoses   Final diagnoses:  Left flank pain  Urinary frequency    New Prescriptions Discharge Medication List as of 11/20/2015  2:10 PM       Emi Holes, PA-C 11/20/15 1419    Laurence Spates, MD 11/23/15 [phone]

## 2015-11-21 LAB — GC/CHLAMYDIA PROBE AMP (~~LOC~~) NOT AT ARMC
Chlamydia: NEGATIVE
Neisseria Gonorrhea: NEGATIVE

## 2016-06-24 ENCOUNTER — Emergency Department (HOSPITAL_COMMUNITY)
Admission: EM | Admit: 2016-06-24 | Discharge: 2016-06-24 | Disposition: A | Discharge status: Home / Self Care | Payer: Self-pay | Attending: Emergency Medicine | Admitting: Emergency Medicine

## 2016-06-24 ENCOUNTER — Encounter (HOSPITAL_COMMUNITY): Payer: Self-pay

## 2016-06-24 ENCOUNTER — Emergency Department (HOSPITAL_COMMUNITY): Payer: Self-pay

## 2016-06-24 DIAGNOSIS — Y929 Unspecified place or not applicable: Secondary | ICD-10-CM | POA: Insufficient documentation

## 2016-06-24 DIAGNOSIS — Y999 Unspecified external cause status: Secondary | ICD-10-CM | POA: Insufficient documentation

## 2016-06-24 DIAGNOSIS — W228XXA Striking against or struck by other objects, initial encounter: Secondary | ICD-10-CM | POA: Insufficient documentation

## 2016-06-24 DIAGNOSIS — S2241XA Multiple fractures of ribs, right side, initial encounter for closed fracture: Secondary | ICD-10-CM | POA: Insufficient documentation

## 2016-06-24 DIAGNOSIS — F17210 Nicotine dependence, cigarettes, uncomplicated: Secondary | ICD-10-CM | POA: Insufficient documentation

## 2016-06-24 DIAGNOSIS — Z79899 Other long term (current) drug therapy: Secondary | ICD-10-CM | POA: Insufficient documentation

## 2016-06-24 DIAGNOSIS — Y939 Activity, unspecified: Secondary | ICD-10-CM | POA: Insufficient documentation

## 2016-06-24 MED ORDER — OXYCODONE-ACETAMINOPHEN 5-325 MG PO TABS
1.0000 | ORAL_TABLET | Freq: Once | ORAL | Status: AC
  Administered 2016-06-24: 1 via ORAL
  Filled 2016-06-24: qty 1

## 2016-06-24 MED ORDER — IBUPROFEN 800 MG PO TABS: 800.0000 mg | ORAL_TABLET | Freq: Three times a day (TID) | ORAL | 0 refills | Status: DC

## 2016-06-24 MED ORDER — OXYCODONE-ACETAMINOPHEN 5-325 MG PO TABS: 1.0000 | ORAL_TABLET | ORAL | 0 refills | Status: DC | PRN

## 2016-06-24 NOTE — Discharge Instructions (Signed)
As a discussed, your x-rays today did reveal 2 right-sided rib fractures. These should heal up over the next few weeks. Take the pain medication as prescribed. Do not drive while taking this, it can make you drowsy. May take Motrin in between doses to help reduce pain. You may continue to have some soreness. Make sure to use her incentive spirometer to make sure your breathing efficiently. Try to avoid any heavy lifting, pushing, pulling, or other strenuous activity for the next week to allow your ribs to heal. Return here for any normal worsening symptoms including severe pain, shortness of breath, high fever, etc.

## 2016-06-24 NOTE — ED Provider Notes (Signed)
[facility]-EMERGENCY DEPT Provider Note   CSN: [id number] Arrival date & time: 06/24/16  1224   By signing my name below, I, Teofilo PodMatthew P. Jamison, attest that this documentation has been prepared under the direction and in the presence of Sharilyn SitesLisa Tanekia Ryans, PA-C. Electronically Signed: Teofilo PodMatthew P. Jamison, ED Scribe. 06/24/2016. 2:59 PM.   History   Chief Complaint Chief Complaint  Patient presents with  . Rib Injury    The history is provided by the patient. No language interpreter was used.   HPI Comments:  Benjamin Chandler is a 36 y.o. male who presents to the Emergency Department s/p right rib injury that occurred PTA. Pt reports that he was carrying a large piece of granite and dropped in, and the he fell on to in. The edge of the granite pushed into his right ribs. Pt states that the pain is worse with moving, breathing, coughing. He denies any previous rib injury. No known allergies to medications. Pt has taken ibuprofen with no relief. Pt denies other associated symptoms.   History reviewed. No pertinent past medical history.  Patient Active Problem List   Diagnosis Date Noted  . Facial abscess 07/25/2015  . Facial cellulitis 07/25/2015  . Dental abscess 07/25/2015    History reviewed. No pertinent surgical history.     Home Medications    Prior to Admission medications   Medication Sig Start Date End Date Taking? Authorizing Provider  amoxicillin-clavulanate (AUGMENTIN) 875-125 MG tablet Take 1 tablet by mouth every 12 (twelve) hours. 07/27/15   Rhetta MuraJai-Gurmukh Samtani, MD  ibuprofen (ADVIL,MOTRIN) 200 MG tablet Take 4 tablets (800 mg total) by mouth 3 (three) times daily. 07/26/15   Rhetta MuraJai-Gurmukh Samtani, MD    Family History Family History  Problem Relation Age of Onset  . Cancer Father     Social History Social History  Substance Use Topics  . Smoking status: Current Every Day Smoker    Packs/day: 0.50    Years: 5.00    Types: Cigarettes  . Smokeless tobacco: Never Used    . Alcohol use Yes     Comment: social     Allergies   Patient has no known allergies.   Review of Systems Review of Systems  Constitutional: Negative for fever.  Respiratory:       Positive for rib pain.  Neurological: Negative for syncope.  All other systems reviewed and are negative.    Physical Exam Updated Vital Signs BP 138/82 (BP Location: Left Arm)   Pulse 81   Temp 98.3 F (36.8 C) (Oral)   Resp 18   Ht 6\' 2"  (1.88 m)   Wt 205 lb (93 kg)   SpO2 98%   BMI 26.32 kg/m   Physical Exam  Constitutional: He is oriented to person, place, and time. He appears well-developed and well-nourished.  HENT:  Head: Normocephalic and atraumatic.  Mouth/Throat: Oropharynx is clear and moist.  Eyes: Conjunctivae and EOM are normal. Pupils are equal, round, and reactive to light.  Neck: Normal range of motion.  Cardiovascular: Normal rate, regular rhythm and normal heart sounds.   Pulmonary/Chest: Effort normal and breath sounds normal.  Abrasion and bruising noted to right 8th-9th rib area; very TTP without noted deformity or crepitus; no flail segment; lungs clear, no distress, patient with apparent respiratory splinting, O2 sats stable  Abdominal: Soft. Bowel sounds are normal.  Musculoskeletal: Normal range of motion.  Neurological: He is alert and oriented to person, place, and time.  Skin: Skin is warm and dry.  Psychiatric: He has a normal mood and affect.  Nursing note and vitals reviewed.    ED Treatments / Results  DIAGNOSTIC STUDIES:  Oxygen Saturation is 98% on RA, normal by my interpretation.    COORDINATION OF CARE:  2:58 PM Discussed treatment plan with pt at bedside and pt agreed to plan.   Labs (all labs ordered are listed, but only abnormal results are displayed) Labs Reviewed - No data to display  EKG  EKG Interpretation None       Radiology Dg Ribs Unilateral W/chest Right  Result Date: 06/24/2016 CLINICAL DATA:  Injury. EXAM: RIGHT  RIBS AND CHEST - 3+ VIEW COMPARISON:  09/26/2011 . FINDINGS: Mediastinum and hilar structures are normal. Lungs are clear. Heart size normal. No pleural effusion or pneumothorax. No acute bony abnormality. Right anterolateral eighth and ninth rib fractures noted. IMPRESSION: Right anterolateral eighth and ninth rib fractures noted. No pneumothorax. Electronically Signed   By: Maisie Fus  Register   On: 06/24/2016 15:51    Procedures Procedures (including critical care time)  Medications Ordered in ED Medications - No data to display   Initial Impression / Assessment and Plan / ED Course  I have reviewed the triage vital signs and the nursing notes.  Pertinent labs & imaging results that were available during my care of the patient were reviewed by me and considered in my medical decision making (see chart for details).  36 year old male here with right rib pain. He slipped and fell with a piece of granite, thus fell into the granite with the impact of the right ribs. Reports pain of right ribs. He does have abrasion, bruising, and tenderness over the right eighth and ninth ribs without gross deformity on exam. He has apparent respiratory splinting but is in no acute distress. Vitals are stable. Rib films were obtained revealing eighth and ninth rib fractures without associated pneumothorax. Patient's vitals remain stable.  Pain was treated here with Percocet. Patient was given incentive spirometer and instructed on use. He was put on restricted duty at work the next week, advised to avoid heavy lifting or other strenuous activity to allow ribs to heal. He was given strict return precautions for any new or worsening symptoms including severe pain, shortness of breath, fever, etc. Patient acknowledged understanding and agreed with care plan.  Final Clinical Impressions(s) / ED Diagnoses   Final diagnoses:  Closed fracture of multiple ribs of right side, initial encounter    New  Prescriptions Discharge Medication List as of 06/24/2016  4:22 PM    START taking these medications   Details  oxyCODONE-acetaminophen (PERCOCET) 5-325 MG tablet Take 1 tablet by mouth every 4 (four) hours as needed., Starting Tue 06/24/2016, Print       I personally performed the services described in this documentation, which was scribed in my presence. The recorded information has been reviewed and is accurate.   Garlon Hatchet, PA-C 06/24/16 1709    Jacalyn Lefevre, MD 06/25/16 [phone]

## 2016-06-24 NOTE — ED Notes (Signed)
Patient transported to X-ray 

## 2016-06-24 NOTE — ED Triage Notes (Signed)
Patient was remodeling his bathroom and carrying a piece of granite up the stairs and tripped, landing on the granite , hitting the right rib cage area.

## 2016-06-24 NOTE — ED Notes (Signed)
ED Provider at bedside. 

## 2017-10-11 ENCOUNTER — Emergency Department (HOSPITAL_COMMUNITY)
Admission: EM | Admit: 2017-10-11 | Discharge: 2017-10-11 | Disposition: A | Discharge status: Home / Self Care | Payer: Self-pay | Attending: Emergency Medicine | Admitting: Emergency Medicine

## 2017-10-11 ENCOUNTER — Encounter (HOSPITAL_COMMUNITY): Payer: Self-pay | Admitting: Emergency Medicine

## 2017-10-11 DIAGNOSIS — L02411 Cutaneous abscess of right axilla: Secondary | ICD-10-CM | POA: Insufficient documentation

## 2017-10-11 DIAGNOSIS — Z79899 Other long term (current) drug therapy: Secondary | ICD-10-CM | POA: Insufficient documentation

## 2017-10-11 DIAGNOSIS — F17210 Nicotine dependence, cigarettes, uncomplicated: Secondary | ICD-10-CM | POA: Insufficient documentation

## 2017-10-11 MED ORDER — SULFAMETHOXAZOLE-TRIMETHOPRIM 800-160 MG PO TABS: 1.0000 | ORAL_TABLET | Freq: Two times a day (BID) | ORAL | 0 refills | Status: AC

## 2017-10-11 MED ORDER — LIDOCAINE-EPINEPHRINE (PF) 2 %-1:200000 IJ SOLN
20.0000 mL | Freq: Once | INTRAMUSCULAR | Status: AC
  Administered 2017-10-11: 20 mL
  Filled 2017-10-11: qty 20

## 2017-10-11 NOTE — ED Triage Notes (Signed)
Patient here from home with complaints of right side arm pain due to abscess under arm. Currently not draining. Hx of same.

## 2017-10-11 NOTE — ED Notes (Signed)
ED Provider at bedside. 

## 2017-10-11 NOTE — ED Provider Notes (Signed)
[facility]-EMERGENCY DEPT Provider Note   CSN: [id number] Arrival date & time: 10/11/17  4098     History   Chief Complaint Chief Complaint  Patient presents with  . Abscess    HPI Benjamin Chandler is a 37 y.o. male presents today for evaluation of acute onset, progressively worsening erythema and swelling under the right axilla for 1 week.  He notes constant throbbing pain, worsens with movement and palpation.  He has attempted applying Neosporin without significant relief of his symptoms.  States he has had similar symptoms in the past which required I&D.  Denies fever, nausea, vomiting.  The history is provided by the patient.    History reviewed. No pertinent past medical history.  Patient Active Problem List   Diagnosis Date Noted  . Facial abscess 07/25/2015  . Facial cellulitis 07/25/2015  . Dental abscess 07/25/2015    History reviewed. No pertinent surgical history.      Home Medications    Prior to Admission medications   Medication Sig Start Date End Date Taking? Authorizing Provider  amoxicillin-clavulanate (AUGMENTIN) 875-125 MG tablet Take 1 tablet by mouth every 12 (twelve) hours. Patient not taking: Reported on 06/24/2016 07/27/15   Rhetta Mura, MD  ibuprofen (ADVIL,MOTRIN) 800 MG tablet Take 1 tablet (800 mg total) by mouth 3 (three) times daily. 06/24/16   Garlon Hatchet, PA-C  oxyCODONE-acetaminophen (PERCOCET) 5-325 MG tablet Take 1 tablet by mouth every 4 (four) hours as needed. 06/24/16   Garlon Hatchet, PA-C  sulfamethoxazole-trimethoprim (BACTRIM DS,SEPTRA DS) 800-160 MG tablet Take 1 tablet by mouth 2 (two) times daily for 5 days. 10/11/17 10/16/17  Jeanie Sewer, PA-C    Family History Family History  Problem Relation Age of Onset  . Cancer Father     Social History Social History   Tobacco Use  . Smoking status: Current Every Day Smoker    Packs/day: 0.50    Years: 5.00    Pack years: 2.50    Types:  Cigarettes  . Smokeless tobacco: Never Used  Substance Use Topics  . Alcohol use: Yes    Comment: social  . 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   Review of Systems Review of Systems  Constitutional: Negative for chills and fever.  Gastrointestinal: Negative for nausea and vomiting.  Skin: Positive for color change.       +abscess     Physical Exam Updated Vital Signs BP 126/73 (BP Location: Left Arm)   Pulse 84   Temp 98.7 F (37.1 C) (Oral)   Resp 14   SpO2 99%   Physical Exam  Constitutional: He appears well-developed and well-nourished. No distress.  HENT:  Head: Normocephalic and atraumatic.  Eyes: Conjunctivae are normal. Right eye exhibits no discharge. Left eye exhibits no discharge.  Neck: No JVD present. No tracheal deviation present.  Cardiovascular: Normal rate.  Pulmonary/Chest: Effort normal.  Abdominal: He exhibits no distension.  Musculoskeletal: He exhibits no edema.  Neurological: He is alert.  Skin: Skin is warm and dry. There is erythema.  2 x 3 cm area of fluctuance to the right axilla, draining purulent material from a pinpoint opening in the skin.  There is surrounding erythema.  Tender to palpation. There is a similar 1x1cm area of induration to the right axilla medial to the larger abscess. This area is not particularly fluctuant, no drainage.  Psychiatric: He has a normal mood and affect. His behavior is normal.  Nursing note and vitals reviewed.    ED Treatments / Results  Labs (all labs ordered are listed, but only abnormal results are displayed) Labs Reviewed - No data to display  EKG None  Radiology No results found.  Procedures .Marland Kitchen.Incision and Drainage Date/Time: 10/11/2017 11:35 AM Performed by: Jeanie SewerFawze, Jakota Manthei A, PA-C Authorized by: Jeanie SewerFawze, Juanjesus Pepperman A, PA-C   Consent:    Consent obtained:  Verbal   Consent given by:  Patient   Risks discussed:  Bleeding,  incomplete drainage, infection and pain Location:    Type:  Abscess   Size:  2x3cm   Location:  Upper extremity   Upper extremity location:  Arm   Arm location:  R upper arm (r axilla) Pre-procedure details:    Skin preparation:  Betadine Anesthesia (see MAR for exact dosages):    Anesthesia method:  Local infiltration   Local anesthetic:  Lidocaine 2% WITH epi Procedure type:    Complexity:  Simple Procedure details:    Needle aspiration: no     Incision types:  Stab incision   Incision depth:  Subcutaneous   Scalpel blade:  11   Wound management:  Probed and deloculated, irrigated with saline and extensive cleaning   Drainage:  Bloody and purulent   Drainage amount:  Moderate   Wound treatment:  Wound left open   Packing materials:  1/4 in iodoform gauze Post-procedure details:    Patient tolerance of procedure:  Tolerated well, no immediate complications   (including critical care time)  Medications Ordered in ED Medications  lidocaine-EPINEPHrine (XYLOCAINE W/EPI) 2 %-1:200000 (PF) injection 20 mL (20 mLs Infiltration Given by Other 10/11/17 1055)     Initial Impression / Assessment and Plan / ED Course  I have reviewed the triage vital signs and the nursing notes.  Pertinent labs & imaging results that were available during my care of the patient were reviewed by me and considered in my medical decision making (see chart for details).     Patient with skin abscess amenable to incision and drainage.  Patient is afebrile, vital signs are stable.  He is nontoxic in appearance.  No systemic symptoms.  Abscess did require packing,  wound recheck in 2 days. Encouraged home warm soaks and flushing.  There are signs of surrounding cellulitis and another similar lesion that was not amenable to I&D.  Will discharge home with a short course of Bactrim.  Discussed indications for return to the ED sooner. Pt verbalized understanding of and agreement with plan and is safe for  discharge home at this time.   Final Clinical Impressions(s) / ED Diagnoses   Final diagnoses:  Abscess of axilla, right    ED Discharge Orders        Ordered    sulfamethoxazole-trimethoprim (BACTRIM DS,SEPTRA DS) 800-160 MG tablet  2 times daily     10/11/17 1135       Chaela Branscum, [place] A, PA-C 10/11/17 1140    Margarita Grizzleay, Danielle, MD 10/11/17 1523

## 2017-10-11 NOTE — Discharge Instructions (Signed)
Please take all of your antibiotics until finished!   You may develop abdominal discomfort or diarrhea from the antibiotic.  You may help offset this with probiotics which you can buy or get in yogurt. Do not eat  or take the probiotics until 2 hours after your antibiotic.    Keep wound clean and dry. Apply warm compresses throughout the day. Alternate 600 mg of ibuprofen and [phone] mg of Tylenol every 3 hours as needed for pain. Do not exceed 4000 mg of Tylenol daily.  [facility] 2 days for wound recheck and packing removal.  Return to emergency department for emergent changing or worsening symptoms such as worsening spread of redness, abnormal drainage, fevers.

## 2018-02-20 ENCOUNTER — Emergency Department (HOSPITAL_COMMUNITY)
Admission: EM | Admit: 2018-02-20 | Discharge: 2018-02-20 | Disposition: A | Discharge status: Home / Self Care | Payer: Self-pay | Attending: Emergency Medicine | Admitting: Emergency Medicine

## 2018-02-20 ENCOUNTER — Emergency Department (HOSPITAL_COMMUNITY): Payer: Self-pay

## 2018-02-20 ENCOUNTER — Other Ambulatory Visit: Payer: Self-pay

## 2018-02-20 ENCOUNTER — Encounter (HOSPITAL_COMMUNITY): Payer: Self-pay | Admitting: Obstetrics and Gynecology

## 2018-02-20 DIAGNOSIS — R079 Chest pain, unspecified: Secondary | ICD-10-CM | POA: Insufficient documentation

## 2018-02-20 DIAGNOSIS — Z87891 Personal history of nicotine dependence: Secondary | ICD-10-CM | POA: Insufficient documentation

## 2018-02-20 DIAGNOSIS — Z79899 Other long term (current) drug therapy: Secondary | ICD-10-CM | POA: Insufficient documentation

## 2018-02-20 LAB — I-STAT TROPONIN, ED: Troponin i, poc: 0 ng/mL (ref 0.00–0.08)

## 2018-02-20 LAB — CBC
HEMATOCRIT: 43.2 % (ref 39.0–52.0)
HEMOGLOBIN: 14.5 g/dL (ref 13.0–17.0)
MCH: 29.8 pg (ref 26.0–34.0)
MCHC: 33.6 g/dL (ref 30.0–36.0)
MCV: 88.7 fL (ref 80.0–100.0)
Platelets: 208 10*3/uL (ref 150–400)
RBC: 4.87 MIL/uL (ref 4.22–5.81)
RDW: 11.6 % (ref 11.5–15.5)
WBC: 6.3 10*3/uL (ref 4.0–10.5)
nRBC: 0 % (ref 0.0–0.2)

## 2018-02-20 LAB — BASIC METABOLIC PANEL
Anion gap: 7 (ref 5–15)
BUN: 20 mg/dL (ref 6–20)
CHLORIDE: 110 mmol/L (ref 98–111)
CO2: 23 mmol/L (ref 22–32)
CREATININE: 0.96 mg/dL (ref 0.61–1.24)
Calcium: 9.1 mg/dL (ref 8.9–10.3)
GFR calc Af Amer: >60 mL/min (ref >60)
GFR calc non Af Amer: >60 mL/min (ref >60)
GLUCOSE: 106 mg/dL — AB (ref 70–99)
POTASSIUM: 4.1 mmol/L (ref 3.5–5.1)
SODIUM: 140 mmol/L (ref 135–145)

## 2018-02-20 LAB — D-DIMER, QUANTITATIVE: D-Dimer, Quant: <0.27 ug/mL-FEU (ref 0.00–0.50)

## 2018-02-20 MED ORDER — METHOCARBAMOL 500 MG PO TABS: 500.0000 mg | ORAL_TABLET | Freq: Two times a day (BID) | ORAL | 0 refills | Status: DC

## 2018-02-20 MED ORDER — IBUPROFEN 600 MG PO TABS: 600.0000 mg | ORAL_TABLET | Freq: Four times a day (QID) | ORAL | 0 refills | Status: DC | PRN

## 2018-02-20 MED ORDER — KETOROLAC TROMETHAMINE 30 MG/ML IJ SOLN
30.0000 mg | Freq: Once | INTRAMUSCULAR | Status: AC
  Administered 2018-02-20: 30 mg via INTRAVENOUS
  Filled 2018-02-20: qty 1

## 2018-02-20 NOTE — ED Provider Notes (Signed)
[facility]-EMERGENCY DEPT Provider Note   CSN: [id number] Arrival date & time: 02/20/18  [id number]     History   Chief Complaint Chief Complaint  Patient presents with  . Chest Pain  . Shortness of Breath  . Arm Pain    HPI Oralia RudWilliam Mcveigh is a 37 y.o. male.  37 year old male presents with 4 days of persistent left-sided chest discomfort which she states is worse with movement.  Pain has been constant in nature.  No associated fever or cough.  Pain goes down to his left arm and is also worse with movement as well 2.  He denies any syncope or near syncope.  Endorses palpitations.  No leg pain or swelling.  Took aspirin which did help his symptoms.  No prior history of same.  Denies any history of cardiac disease.     History reviewed. No pertinent past medical history.  Patient Active Problem List   Diagnosis Date Noted  . Facial abscess 07/25/2015  . Facial cellulitis 07/25/2015  . Dental abscess 07/25/2015    No past surgical history on file.      Home Medications    Prior to Admission medications   Medication Sig Start Date End Date Taking? Authorizing Provider  amoxicillin-clavulanate (AUGMENTIN) 875-125 MG tablet Take 1 tablet by mouth every 12 (twelve) hours. Patient not taking: Reported on 06/24/2016 07/27/15   Rhetta MuraSamtani, Jai-Gurmukh, MD  ibuprofen (ADVIL,MOTRIN) 800 MG tablet Take 1 tablet (800 mg total) by mouth 3 (three) times daily. 06/24/16   Garlon HatchetSanders, Lisa M, PA-C  oxyCODONE-acetaminophen (PERCOCET) 5-325 MG tablet Take 1 tablet by mouth every 4 (four) hours as needed. 06/24/16   Garlon HatchetSanders, Lisa M, PA-C    Family History Family History  Problem Relation Age of Onset  . Cancer Father     Social History Social History   Tobacco Use  . Smoking status: Former Smoker    Packs/day: 0.50    Years: 5.00    Pack years: 2.50    Types: Cigarettes  . Smokeless tobacco: Never Used  . Tobacco comment: Quit 08/2017  Substance Use Topics  .  Alcohol use: Yes    Comment: social  . 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   Review of Systems Review of Systems  All other systems reviewed and are negative.    Physical Exam Updated Vital Signs BP 139/65 (BP Location: Left Arm)   Pulse 90   Temp 98 F (36.7 C) (Oral)   Resp 16   SpO2 98%   Physical Exam  Constitutional: He is oriented to person, place, and time. He appears well-developed and well-nourished.  Non-toxic appearance. No distress.  HENT:  Head: Normocephalic and atraumatic.  Eyes: Pupils are equal, round, and reactive to light. Conjunctivae, EOM and lids are normal.  Neck: Normal range of motion. Neck supple. No tracheal deviation present. No thyroid mass present.  Cardiovascular: Normal rate, regular rhythm and normal heart sounds. Exam reveals no gallop.  No murmur heard. Pulmonary/Chest: Effort normal and breath sounds normal. No stridor. No respiratory distress. He has no decreased breath sounds. He has no wheezes. He has no rhonchi. He has no rales.  Abdominal: Soft. Normal appearance and bowel sounds are normal. He exhibits no distension. There is no tenderness. There is no rebound and no CVA tenderness.  Musculoskeletal: Normal range of motion. He exhibits no edema or tenderness.  Arms: Neurological: He is alert and oriented to person, place, and time. He has normal strength. No cranial nerve deficit or sensory deficit. GCS eye subscore is 4. GCS verbal subscore is 5. GCS motor subscore is 6.  Skin: Skin is warm and dry. No abrasion and no rash noted.  Psychiatric: He has a normal mood and affect. His speech is normal and behavior is normal.  Nursing note and vitals reviewed.    ED Treatments / Results  Labs (all labs ordered are listed, but only abnormal results are displayed) Labs Reviewed  CBC  BASIC METABOLIC PANEL  D-DIMER, QUANTITATIVE (NOT AT [facility])  I-STAT TROPONIN,  ED    EKG EKG Interpretation  Date/Time:  Saturday February 20 2018 08:35:44 EST Ventricular Rate:  73 PR Interval:    QRS Duration: 73 QT Interval:  355 QTC Calculation: 392 R Axis:   70 Text Interpretation:  Sinus rhythm Ventricular bigeminy Abnormal R-wave progression, early transition Minimal ST elevation, inferior leads Baseline wander in lead(s) V1 Confirmed by Lorre Nick ([id number]) on 02/20/2018 9:08:57 AM   Radiology Dg Chest 2 View  Result Date: 02/20/2018 CLINICAL DATA:  Patient with left-sided chest pain. EXAM: CHEST - 2 VIEW COMPARISON:  Chest radiograph 06/24/2016. FINDINGS: Monitoring leads overlie the patient. Normal cardiac and mediastinal contours. No consolidative pulmonary opacities. No pleural effusion or pneumothorax. Regional skeleton is unremarkable. IMPRESSION: No acute cardiopulmonary process. Electronically Signed   By: Annia Belt M.D.   On: 02/20/2018 09:14    Procedures Procedures (including critical care time)  Medications Ordered in ED Medications - No data to display   Initial Impression / Assessment and Plan / ED Course  I have reviewed the triage vital signs and the nursing notes.  Pertinent labs & imaging results that were available during my care of the patient were reviewed by me and considered in my medical decision making (see chart for details).    Patient with occasional PVCs on his monitor.  EKG however does not show any signs of ischemic changes.  Chest x-ray also within normal limits.  D-dimer and troponin negative.  Patient symptoms appear to be musculoskeletal and responded well to IV Toradol.  Return precautions given  Final Clinical Impressions(s) / ED Diagnoses   Final diagnoses:  None    ED Discharge Orders    None       Lorre Nick, MD 02/20/18 1014

## 2018-02-20 NOTE — ED Triage Notes (Signed)
Pt reports chest pain for the last 4 days. Pt reports SOB with walking long distances and arm pain. Pt reports a tightness in his chest and states it woke him up out of his sleep today. Pt reports he quit smoking approximately 6 months ago but recently started vaping.

## 2018-07-07 ENCOUNTER — Encounter (HOSPITAL_COMMUNITY): Payer: Self-pay

## 2018-07-07 ENCOUNTER — Emergency Department (HOSPITAL_COMMUNITY)
Admission: EM | Admit: 2018-07-07 | Discharge: 2018-07-07 | Disposition: A | Discharge status: Home / Self Care | Payer: Self-pay | Attending: Emergency Medicine | Admitting: Emergency Medicine

## 2018-07-07 ENCOUNTER — Other Ambulatory Visit: Payer: Self-pay

## 2018-07-07 DIAGNOSIS — Z87891 Personal history of nicotine dependence: Secondary | ICD-10-CM | POA: Insufficient documentation

## 2018-07-07 DIAGNOSIS — K0889 Other specified disorders of teeth and supporting structures: Secondary | ICD-10-CM | POA: Insufficient documentation

## 2018-07-07 DIAGNOSIS — Z7982 Long term (current) use of aspirin: Secondary | ICD-10-CM | POA: Insufficient documentation

## 2018-07-07 DIAGNOSIS — Z79899 Other long term (current) drug therapy: Secondary | ICD-10-CM | POA: Insufficient documentation

## 2018-07-07 MED ORDER — IBUPROFEN 600 MG PO TABS: 600.0000 mg | ORAL_TABLET | Freq: Four times a day (QID) | ORAL | 0 refills | Status: DC | PRN

## 2018-07-07 MED ORDER — PENICILLIN V POTASSIUM 500 MG PO TABS: 500.0000 mg | ORAL_TABLET | Freq: Three times a day (TID) | ORAL | 0 refills | Status: DC

## 2018-07-07 NOTE — ED Provider Notes (Signed)
[facility]-EMERGENCY DEPT Provider Note   CSN: [id number] Arrival date & time: 07/07/18  1301    History   Chief Complaint Chief Complaint  Patient presents with  . Dental Pain    HPI Benjamin Chandler is a 38 y.o. male.     The history is provided by the patient. No language interpreter was used.  Dental Pain  Associated symptoms: no fever      38 year old male presenting for evaluation of dental pain.  Patient report he has had recurrent pain to his right lower tooth ongoing for the past year.  For the past week it has increased in intensity.  Described pain as an achy sensation throbbing, worsening with chewing, moderate in severity and nonradiating.  Sometimes intermedius bothers him and sometimes after change does bother him.  He tries taking ibuprofen at home without adequate relief.  He mention been seen by dentist several months back but no specific treatment provided.  He denies any injury denies any fever productive cough, sore throat or neck pain.  No past medical history on file.  Patient Active Problem List   Diagnosis Date Noted  . Facial abscess 07/25/2015  . Facial cellulitis 07/25/2015  . Dental abscess 07/25/2015    No past surgical history on file.      Home Medications    Prior to Admission medications   Medication Sig Start Date End Date Taking? Authorizing Provider  aspirin 81 MG tablet Take 81 mg by mouth daily.    [provider]  ibuprofen (ADVIL,MOTRIN) 600 MG tablet Take 1 tablet (600 mg total) by mouth every 6 (six) hours as needed. 02/20/18   Lorre Nick, MD  methocarbamol (ROBAXIN) 500 MG tablet Take 1 tablet (500 mg total) by mouth 2 (two) times daily. 02/20/18   Lorre Nick, MD    Family History Family History  Problem Relation Age of Onset  . Cancer Father     Social History Social History   Tobacco Use  . Smoking status: Former Smoker    Packs/day: 0.50    Years: 5.00    Pack years: 2.50     Types: Cigarettes  . Smokeless tobacco: Never Used  . Tobacco comment: Quit 08/2017  Substance Use Topics  . Alcohol use: Yes    Comment: social  . 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   Review of Systems Review of Systems  Constitutional: Negative for fever.  HENT: Positive for dental problem.      Physical Exam Updated Vital Signs BP (!) 144/75   Pulse 90   Temp 98.6 F (37 C) (Oral)   Resp 18   Ht 6\' 2"  (1.88 m)   Wt 96.2 kg   SpO2 98%   BMI 27.22 kg/m   Physical Exam Vitals signs and nursing note reviewed.  Constitutional:      General: He is not in acute distress.    Appearance: He is well-developed.  HENT:     Head: Atraumatic.     Mouth/Throat:     Comments: Mouth: Tenderness to tooth #29 without any obvious gingival erythema edema or swelling.  No obvious dental decay appreciated.  No trismus.  Throat was unremarkable.  No malocclusion. Eyes:     Conjunctiva/sclera: Conjunctivae normal.  Neck:     Musculoskeletal: Neck supple. No neck rigidity.  Lymphadenopathy:     Cervical: No cervical  adenopathy.  Skin:    Findings: No rash.  Neurological:     Mental Status: He is alert.      ED Treatments / Results  Labs (all labs ordered are listed, but only abnormal results are displayed) Labs Reviewed - No data to display  EKG None  Radiology No results found.  Procedures Procedures (including critical care time)  Medications Ordered in ED Medications - No data to display   Initial Impression / Assessment and Plan / ED Course  I have reviewed the triage vital signs and the nursing notes.  Pertinent labs & imaging results that were available during my care of the patient were reviewed by me and considered in my medical decision making (see chart for details).        BP (!) 144/75   Pulse 90   Temp 98.6 F (37 C) (Oral)   Resp 18   Ht 6\' 2"  (1.88 m)   Wt 96.2 kg    SpO2 98%   BMI 27.22 kg/m    Final Clinical Impressions(s) / ED Diagnoses   Final diagnoses:  Pain, dental    ED Discharge Orders         Ordered    penicillin v potassium (VEETID) 500 MG tablet  3 times daily     07/07/18 1319    ibuprofen (ADVIL,MOTRIN) 600 MG tablet  Every 6 hours PRN     07/07/18 1319         Patient with dentalgia.  No abscess requiring immediate incision and drainage.  Exam not concerning for Ludwig's angina or pharyngeal abscess.  Will treat with PCN and NSAIDs. Pt instructed to follow-up with dentist.  Discussed return precautions. Pt safe for discharge.    Fayrene Helperran, Valla Pacey, PA-C 07/07/18 1320    Raeford RazorKohut, Stephen, MD 07/07/18 [phone]

## 2018-07-07 NOTE — ED Triage Notes (Signed)
Pt c/o right lower dental pain for several months. Pt was seen at the dentist a few months ago.

## 2019-04-30 ENCOUNTER — Emergency Department (HOSPITAL_COMMUNITY)
Admission: EM | Admit: 2019-04-30 | Discharge: 2019-04-30 | Disposition: A | Discharge status: Home / Self Care | Payer: Self-pay | Attending: Emergency Medicine | Admitting: Emergency Medicine

## 2019-04-30 ENCOUNTER — Encounter (HOSPITAL_COMMUNITY): Payer: Self-pay

## 2019-04-30 ENCOUNTER — Other Ambulatory Visit: Payer: Self-pay

## 2019-04-30 DIAGNOSIS — K645 Perianal venous thrombosis: Secondary | ICD-10-CM | POA: Insufficient documentation

## 2019-04-30 DIAGNOSIS — Z7982 Long term (current) use of aspirin: Secondary | ICD-10-CM | POA: Insufficient documentation

## 2019-04-30 DIAGNOSIS — Z87891 Personal history of nicotine dependence: Secondary | ICD-10-CM | POA: Insufficient documentation

## 2019-04-30 MED ORDER — DOCUSATE SODIUM 250 MG PO CAPS: 250.0000 mg | ORAL_CAPSULE | Freq: Every day | ORAL | 0 refills | Status: AC

## 2019-04-30 MED ORDER — LIDOCAINE HCL (PF) 1 % IJ SOLN
30.0000 mL | Freq: Once | INTRAMUSCULAR | Status: AC
  Administered 2019-04-30: 30 mL
  Filled 2019-04-30: qty 30

## 2019-04-30 MED ORDER — HYDROCORTISONE (PERIANAL) 2.5 % EX CREA: 1.0000 "application " | TOPICAL_CREAM | Freq: Two times a day (BID) | CUTANEOUS | 0 refills | Status: DC

## 2019-04-30 MED ORDER — POLYETHYLENE GLYCOL 3350 17 G PO PACK: 17.0000 g | PACK | Freq: Every day | ORAL | 0 refills | Status: DC

## 2019-04-30 NOTE — ED Notes (Signed)
PA K. Albrizze at bedside with this nurse. PA injected lidocaine into thrombosed hemorrhoid. Patient tolerated well. PA performed I&D of hemorrhoid. Several small clots excised from site. Patient educated on aftercare, still may see some clotting and that's okay but if patient sees very large clots, feels weak or dizzy, patient should return to ED. Patient voiced comprehension.

## 2019-04-30 NOTE — ED Triage Notes (Signed)
Pt presents with c/o hemorrhoids. Pt reports the hemorrhoid has been present for a week. He has been using over the counter medicine with no relief.

## 2019-04-30 NOTE — Discharge Instructions (Addendum)
Hemorrhoids ° °The mainstay of treatment and prevention of hemorrhoids is taking steps to assure regular, soft bowel movements. ° °Hydration: It is recommended that you drink at least eight 8 ounce glasses of water a day to stay well-hydrated. ° °Fiber: May use fiber supplements, such as methylcellulose (eg, Citrucel) or psyllium (eg, Metamucil).  You may also increase the amount of fiber in your diet. ° °Symptomatic treatments ° °Hydrocortisone: Hydrocortisone creams or suppositories may be used to reduce inflammation and provide pain relief.  Use these treatments for no more than 7 days at a time. ° °Which Hazel: Apply as needed up to 6 times per day or after each bowel movement.  This can be found as a liquid or in products such as Tucks or Preparation H pads. ° °Stool softeners: May use stool softeners, such as docusate (generic for Colace), to improve comfort with bowel movements. ° °

## 2019-04-30 NOTE — ED Provider Notes (Signed)
[facility] DEPT Provider Note   CSN: [id number] Arrival date & time: 04/30/19  1049     History Chief Complaint  Patient presents with  . Hemorrhoids    Benjamin Chandler is a 39 y.o. male presenting to emergency department today with chief complaint of hemorrhoid x1 week. He has been using Preparation H without any relief.  He states he has not been able to have a bowel movement in the last 1 week because the pain was so severe.  Had a hemorrhoid years ago but it was much smaller and resolved without intervention.  He rates the pain 9 out of 10 in severity.  He is having difficulty walking, sitting, and working because of the pain.  He denies any trauma or insertion of foreign bodies to the rectum. He denies any fever, chills, abdominal pain, nausea, vomiting, urinary symptoms.     History reviewed. No pertinent past medical history.  Patient Active Problem List   Diagnosis Date Noted  . Facial abscess 07/25/2015  . Facial cellulitis 07/25/2015  . Dental abscess 07/25/2015    History reviewed. No pertinent surgical history.     Family History  Problem Relation Age of Onset  . Cancer Father     Social History   Tobacco Use  . Smoking status: Former Smoker    Packs/day: 0.50    Years: 5.00    Pack years: 2.50    Types: Cigarettes  . Smokeless tobacco: Never Used  . Tobacco comment: Quit 08/2017  Substance Use Topics  . Alcohol use: Not Currently    Comment: social  . 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 Authorizing Provider  aspirin 81 MG tablet Take 81 mg by mouth daily.    [provider]  docusate sodium (COLACE) 250 MG capsule Take 1 capsule (250 mg total) by mouth daily for 7 days. 04/30/19 05/07/19  Geni Skorupski, Harley Hallmark, PA-C  hydrocortisone (ANUSOL-HC) 2.5 % rectal cream Place 1 application  rectally 2 (two) times daily. 04/30/19   Jenevie Casstevens, Harley Hallmark, PA-C    Allergies    Patient has no known allergies.  Review of Systems   Review of Systems All other systems are reviewed and are negative for acute change except as noted in the HPI.  Physical Exam Updated Vital Signs BP (!) 147/85 (BP Location: Left Arm)   Pulse 81   Temp 98 F (36.7 C) (Oral)   Resp 16   SpO2 98%   Physical Exam Vitals and nursing note reviewed.  Constitutional:      General: He is not in acute distress.    Appearance: He is well-developed. He is not ill-appearing or toxic-appearing.  HENT:     Head: Normocephalic and atraumatic.     Nose: Nose normal.  Eyes:     General: No scleral icterus.       Right eye: No discharge.        Left eye: No discharge.     Conjunctiva/sclera: Conjunctivae normal.  Neck:     Vascular: No JVD.  Cardiovascular:     Rate and Rhythm: Normal rate and regular rhythm.     Pulses: Normal pulses.     Heart sounds: Normal heart sounds.  Pulmonary:     Effort: Pulmonary effort is normal.     Breath sounds: Normal breath sounds.  Abdominal:     General: There is no distension.  Genitourinary:    Comments: Chaperone Kat NT present for exam. Patient unable to tolerate DRE secondary to pain. Approximately 2 x 2 cm external hemorrhoid with evidence of thrombosis, tender to palpation  Musculoskeletal:        General: Normal range of motion.     Cervical back: Normal range of motion.  Skin:    General: Skin is warm and dry.  Neurological:     Mental Status: He is oriented to person, place, and time.     GCS: GCS eye subscore is 4. GCS verbal subscore is 5. GCS motor subscore is 6.     Comments: Fluent speech, no facial droop.  Psychiatric:        Behavior: Behavior normal.       ED Results / Procedures / Treatments   Labs (all labs ordered are listed, but only abnormal results are displayed) Labs Reviewed - No data to display  EKG None  Radiology No  results found.  Procedures .Marland KitchenIncision and Drainage  Date/Time: 04/30/2019 12:27 PM Performed by: Sherene Sires, PA-C Authorized by: Sherene Sires, PA-C   Consent:    Consent obtained:  Verbal   Consent given by:  Patient   Risks discussed:  Bleeding, incomplete drainage and infection   Alternatives discussed:  No treatment and delayed treatment Location:    Type:  External thrombosed hemorrhoid   Size:  2x2   Location:  Anogenital Pre-procedure details:    Skin preparation:  Chloraprep Anesthesia (see MAR for exact dosages):    Anesthesia method:  Local infiltration   Local anesthetic:  Lidocaine 1% w/o epi Procedure type:    Complexity:  Simple Procedure details:    Incision types:  Cruciate   Incision depth:  Subcutaneous   Scalpel blade:  11   Wound management:  Probed and deloculated and irrigated with saline   Drainage:  Bloody   Drainage amount:  Moderate   Wound treatment:  Wound left open   Packing materials:  None Post-procedure details:    Patient tolerance of procedure:  Tolerated well, no immediate complications   (including critical care time)  Medications Ordered in ED Medications  lidocaine (PF) (XYLOCAINE) 1 % injection 30 mL (30 mLs Infiltration Given 04/30/19 1221)    ED Course  I have reviewed the triage vital signs and the nursing notes.  Pertinent labs & imaging results that were available during my care of the patient were reviewed by me and considered in my medical decision making (see chart for details).    MDM Rules/Calculators/A&P                     Patient well appearing, in no acute distress. VSS. Pt w/ very large external hemorrhoid on exam with evidence of thrombosis.  Discussed with patient risks and benefits of I&D and he is electing to have an I&D performed because the hemorrhoid is so large, has bothered him for 1 week, and is limiting most of his movements. Give he has failed OTC treatment, this is a reasonable plan.  Discussed at length the risk of infection, bleeding, poor healing however patient would like to continue with plan for I&D. Please see procedure note above. Patient tolerated well. I have given general surgery clinic info and recommended f/u if he does not improve w/ usual hemorrhoid care. Discussed care including MiraLAX and Colace, sitz baths, donut pillow, Anusol, and Tucks pads. Patient  voiced understanding and was discharged in satisfactory condition.   Portions of this note were generated with Scientist, clinical (histocompatibility and immunogenetics). Dictation errors may occur despite best attempts at proofreading.   Final Clinical Impression(s) / ED Diagnoses Final diagnoses:  Thrombosed external hemorrhoid    Rx / DC Orders ED Discharge Orders         Ordered    docusate sodium (COLACE) 250 MG capsule  Daily     04/30/19 1224    hydrocortisone (ANUSOL-HC) 2.5 % rectal cream  2 times daily     04/30/19 [address] 04/30/19 1320    Mancel Bale, MD 05/01/19 0730

## 2019-11-17 ENCOUNTER — Emergency Department (HOSPITAL_COMMUNITY)
Admission: EM | Admit: 2019-11-17 | Discharge: 2019-11-17 | Disposition: A | Discharge status: Home / Self Care | Payer: Self-pay | Attending: Emergency Medicine | Admitting: Emergency Medicine

## 2019-11-17 ENCOUNTER — Other Ambulatory Visit: Payer: Self-pay

## 2019-11-17 ENCOUNTER — Encounter (HOSPITAL_COMMUNITY): Payer: Self-pay | Admitting: Emergency Medicine

## 2019-11-17 ENCOUNTER — Emergency Department (HOSPITAL_COMMUNITY): Payer: Self-pay

## 2019-11-17 DIAGNOSIS — Y9389 Activity, other specified: Secondary | ICD-10-CM | POA: Insufficient documentation

## 2019-11-17 DIAGNOSIS — Z87891 Personal history of nicotine dependence: Secondary | ICD-10-CM | POA: Insufficient documentation

## 2019-11-17 DIAGNOSIS — S61223A Laceration with foreign body of left middle finger without damage to nail, initial encounter: Secondary | ICD-10-CM | POA: Insufficient documentation

## 2019-11-17 DIAGNOSIS — Z23 Encounter for immunization: Secondary | ICD-10-CM | POA: Insufficient documentation

## 2019-11-17 DIAGNOSIS — Y990 Civilian activity done for income or pay: Secondary | ICD-10-CM | POA: Insufficient documentation

## 2019-11-17 DIAGNOSIS — W294XXA Contact with nail gun, initial encounter: Secondary | ICD-10-CM | POA: Insufficient documentation

## 2019-11-17 DIAGNOSIS — Y929 Unspecified place or not applicable: Secondary | ICD-10-CM | POA: Insufficient documentation

## 2019-11-17 MED ORDER — TETANUS-DIPHTH-ACELL PERTUSSIS 5-2.5-18.5 LF-MCG/0.5 IM SUSP
0.5000 mL | Freq: Once | INTRAMUSCULAR | Status: AC
  Administered 2019-11-17: 0.5 mL via INTRAMUSCULAR
  Filled 2019-11-17: qty 0.5

## 2019-11-17 MED ORDER — BACITRACIN ZINC 500 UNIT/GM EX OINT
TOPICAL_OINTMENT | Freq: Once | CUTANEOUS | Status: AC
  Filled 2019-11-17: qty 0.9

## 2019-11-17 MED ORDER — DOXYCYCLINE HYCLATE 100 MG PO TABS
100.0000 mg | ORAL_TABLET | Freq: Once | ORAL | Status: AC
  Administered 2019-11-17: 100 mg via ORAL
  Filled 2019-11-17: qty 1

## 2019-11-17 MED ORDER — DOXYCYCLINE HYCLATE 100 MG PO CAPS: 100.0000 mg | ORAL_CAPSULE | Freq: Two times a day (BID) | ORAL | 0 refills | Status: DC

## 2019-11-17 MED ORDER — HYDROCODONE-ACETAMINOPHEN 5-325 MG PO TABS: 1.0000 | ORAL_TABLET | Freq: Four times a day (QID) | ORAL | 0 refills | Status: DC | PRN

## 2019-11-17 MED ORDER — HYDROCODONE-ACETAMINOPHEN 5-325 MG PO TABS
1.0000 | ORAL_TABLET | Freq: Once | ORAL | Status: AC
  Administered 2019-11-17: 1 via ORAL
  Filled 2019-11-17: qty 1

## 2019-11-17 NOTE — ED Provider Notes (Signed)
[facility]-EMERGENCY DEPT Provider Note   CSN: [id number] Arrival date & time: 11/17/19  1454     History Chief Complaint  Patient presents with  . Hand Pain    Benjamin Chandler is a 39 y.o. male who presents for evaluation of pain, swelling noted to his left middle finger after an injury yesterday.  He reports that he was at work and states that he accidentally shot a nail gun through his finger.  He states that it went on the volar aspect of the finger pad he thinks it came out the other side on the lateral aspect.  He states that he cleaned it out at home.  He comes in today because the pain is getting worse.  He thinks his last tetanus shot was within the last 5 years.  He has had some mild numbness to the tip of the finger.  Denies any weakness, fevers, drainage.  The history is provided by the patient.       History reviewed. No pertinent past medical history.  Patient Active Problem List   Diagnosis Date Noted  . Facial abscess 07/25/2015  . Facial cellulitis 07/25/2015  . Dental abscess 07/25/2015    History reviewed. No pertinent surgical history.     Family History  Problem Relation Age of Onset  . Cancer Father     Social History   Tobacco Use  . Smoking status: Former Smoker    Packs/day: 0.50    Years: 5.00    Pack years: 2.50    Types: Cigarettes  . Smokeless tobacco: Never Used  . Tobacco comment: Quit 08/2017  Vaping Use  . Vaping Use: Former  Substance Use Topics  . Alcohol use: Not Currently    Comment: social  . 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 Authorizing Provider  aspirin 81 MG tablet Take 81 mg by mouth daily.    [provider]  doxycycline (VIBRAMYCIN) 100 MG capsule Take 1 capsule (100 mg total) by mouth 2 (two) times daily. 11/17/19   Maxwell Caul, PA-C    HYDROcodone-acetaminophen (NORCO/VICODIN) 5-325 MG tablet Take 1-2 tablets by mouth every 6 (six) hours as needed. 11/17/19   Maxwell Caul, PA-C  hydrocortisone (ANUSOL-HC) 2.5 % rectal cream Place 1 application rectally 2 (two) times daily. 04/30/19   Albrizze, Kaitlyn E, PA-C  polyethylene glycol (MIRALAX / GLYCOLAX) 17 g packet Take 17 g by mouth daily. 04/30/19   Albrizze, Caroleen Hamman, PA-C    Allergies    Patient has no known allergies.  Review of Systems   Review of Systems  Constitutional: Negative for fever.  Musculoskeletal:       Left third digit pain  Skin: Positive for wound.  Neurological: Positive for numbness. Negative for weakness.  All other systems reviewed and are negative.   Physical Exam Updated Vital Signs BP (!) 141/75   Pulse 90   Temp 98.5 F (36.9 C) (Oral)   Resp 18   Ht 6\' 2"  (1.88 m)   Wt 91.2 kg   SpO2 98%   BMI 25.81 kg/m   Physical Exam Vitals and nursing note reviewed.  Constitutional:      Appearance: He is well-developed.  HENT:     Head: Normocephalic and atraumatic.  Eyes:     General: No scleral icterus.  Right eye: No discharge.        Left eye: No discharge.     Conjunctiva/sclera: Conjunctivae normal.  Cardiovascular:     Pulses:          Radial pulses are 2+ on the right side and 2+ on the left side.  Pulmonary:     Effort: Pulmonary effort is normal.  Musculoskeletal:     Comments: Tenderness palpation of distal tip of the left middle finger.  There is some soft tissue swelling.  No fluctuance.  No overlying warmth, erythema.  Full flexion/extension of all 5 digits intact by difficulty.  Skin:    General: Skin is warm and dry.     Comments: 1 cm linear laceration noted to the volar aspect of the finger pad of the left middle digit. Small laceration noted laterally to the nail. No nail involvement.   Neurological:     Mental Status: He is alert.     Comments: Slight decrease sensation noted to the tip of the distal  aspect of the left middle finger.  Psychiatric:        Speech: Speech normal.        Behavior: Behavior normal.          ED Results / Procedures / Treatments   Labs (all labs ordered are listed, but only abnormal results are displayed) Labs Reviewed - No data to display  EKG None  Radiology DG Finger Middle Left  Result Date: 11/17/2019 CLINICAL DATA:  39 year old male with trauma to the left middle finger EXAM: LEFT MIDDLE FINGER 2+V COMPARISON:  None. FINDINGS: There is no acute fracture or dislocation. The bones are well mineralized. No arthritic changes. There is laceration of the volar soft tissues of the tip of the index finger. A punctate radiopaque focus over the skin noted. IMPRESSION: No acute fracture or dislocation. Electronically Signed   By: Elgie Collard M.D.   On: 11/17/2019 16:06    Procedures Irrigation and debridement  Date/Time: 11/17/2019 7:25 PM Performed by: Maxwell Caul, PA-C Authorized by: Maxwell Caul, PA-C  Preparation: Patient was prepped and draped in the usual sterile fashion. Patient tolerance: patient tolerated the procedure well with no immediate complications Comments: The area was thoroughly and extensively irrigated with over 2 L of sterile saline.  The area was cleaned with a Betadine surgical sponge and slightly debrided with blunt forceps.    (including critical care time)        Medications Ordered in ED Medications  HYDROcodone-acetaminophen (NORCO/VICODIN) 5-325 MG per tablet 1 tablet (1 tablet Oral Given 11/17/19 1909)  doxycycline (VIBRA-TABS) tablet 100 mg (100 mg Oral Given 11/17/19 2004)  bacitracin ointment ( Topical Given 11/17/19 2004)  Tdap (BOOSTRIX) injection 0.5 mL (0.5 mLs Intramuscular Given 11/17/19 2004)    ED Course  I have reviewed the triage vital signs and the nursing notes.  Pertinent labs & imaging results that were available during my care of the patient were reviewed by me and considered  in my medical decision making (see chart for details).    MDM Rules/Calculators/A&P                          39 year old male who presents for evaluation of injury noted middle digit that occurred yesterday.  He reports he was using a nail gun at work and states that it accidentally shot into the pad of his finger and exited right lateral aspect of his nail  bed.  Reports that he comes today because of worsening pain, throbbing.  Patient is afebrile, non-toxic appearing, sitting comfortably on examination table. Vital signs reviewed and stable.  He does have some numbness to the distal tip.  On exam, he went centimeter wound to the pad of the finger with some soft tissue swelling.  No warmth, erythema.  No drainage.  Also has a small wound on the lateral aspect of the nailbed.  Will obtain x-ray for evaluation of any bony abnormality.  Patient told nurses in triage that he did not know when his last tetanus shot was.  I asked him and he told me that it has been within the last 5 years.   X-ray reviewed.  No evidence of acute fracture or dislocation.  There is a laceration and a punctate radiopaque focus over the skin noted.  I discussed with patient that given that this is been greater than 24 hours, repair would not be ideal at this time given concerns for worsening infection.  Patient is in agreement.  I did discuss with him regarding irrigation and debridement.  I did offer digital block but patient declined.  Discussed patient with Dr. Roney Mans (Hand).  Agrees with plan for irrigation and debridement here in the ED.  Agrees with plan for antibiotics and will follow up with patient in outpatient basis next week.  Debridement and irrigation performed as documented above.  There was a small amount of dirt and debris that was removed from the volar aspect of the finger with blunt forceps. The area was then cleaned and irrigated again to ensure no remaining foreign body. Sterile dressing applied.  Patient with no known drug allergies.  Patient reviewed on PMP.  Will give short course of pain medication.  Patient instructed to follow-up with hand as directed.  I again discussed with patient regarding his tetanus and patient states that is up-to-date. At this time, patient exhibits no emergent life-threatening condition that require further evaluation in ED or admission. Patient had ample opportunity for questions and discussion. All patient's questions were answered with full understanding. Strict return precautions discussed. Patient expresses understanding and agreement to plan.   Portions of this note were generated with Scientist, clinical (histocompatibility and immunogenetics). Dictation errors may occur despite best attempts at proofreading.  Final Clinical Impression(s) / ED Diagnoses Final diagnoses:  Laceration of left middle finger with foreign body, nail damage status unspecified, initial encounter    Rx / DC Orders ED Discharge Orders         Ordered    doxycycline (VIBRAMYCIN) 100 MG capsule  2 times daily        11/17/19 1931    HYDROcodone-acetaminophen (NORCO/VICODIN) 5-325 MG tablet  Every 6 hours PRN        11/17/19 1931           Rosana Hoes 11/17/19 2010    Alvira Monday, MD 11/17/19 2133

## 2019-11-17 NOTE — Discharge Instructions (Signed)
Gently clean the wound with soap and water. Make sure to pat dry the wound before covering it with any dressing. You can use topical antibiotic ointment and bandage. Ice and elevate for pain relief.   You can take Tylenol or Ibuprofen as directed for pain. You can alternate Tylenol and Ibuprofen every 4 hours for additional pain relief.   Take pain medications as directed for break through pain. Do not drive or operate machinery while taking this medication.   Follow up with the referred hand doctor. Call his office tomorrow and arrange for an appointment.   Monitor closely for any signs of infection. Return to the Emergency Department for any worsening redness/swelling of the area that begins to spread, drainage from the site, worsening pain, fever or any other worsening or concerning symptoms.

## 2019-11-17 NOTE — ED Triage Notes (Signed)
Patient reports shooting nail through left middle finger yesterday. C/o throbbing pain to finger. Unknown last tetanus shot.

## 2019-11-17 NOTE — ED Notes (Signed)
An After Visit Summary was printed and given to the patient. Discharge instructions given and no further questions at this time.  

## 2020-12-10 ENCOUNTER — Emergency Department (HOSPITAL_COMMUNITY): Payer: Self-pay

## 2020-12-10 ENCOUNTER — Other Ambulatory Visit: Payer: Self-pay

## 2020-12-10 ENCOUNTER — Encounter (HOSPITAL_COMMUNITY): Payer: Self-pay

## 2020-12-10 ENCOUNTER — Emergency Department (HOSPITAL_COMMUNITY)
Admission: EM | Admit: 2020-12-10 | Discharge: 2020-12-10 | Disposition: A | Discharge status: Home / Self Care | Payer: Self-pay | Attending: Emergency Medicine | Admitting: Emergency Medicine

## 2020-12-10 DIAGNOSIS — S0990XA Unspecified injury of head, initial encounter: Secondary | ICD-10-CM | POA: Insufficient documentation

## 2020-12-10 DIAGNOSIS — W109XXA Fall (on) (from) unspecified stairs and steps, initial encounter: Secondary | ICD-10-CM | POA: Insufficient documentation

## 2020-12-10 DIAGNOSIS — F17210 Nicotine dependence, cigarettes, uncomplicated: Secondary | ICD-10-CM | POA: Insufficient documentation

## 2020-12-10 DIAGNOSIS — Y9289 Other specified places as the place of occurrence of the external cause: Secondary | ICD-10-CM | POA: Insufficient documentation

## 2020-12-10 DIAGNOSIS — Z7982 Long term (current) use of aspirin: Secondary | ICD-10-CM | POA: Insufficient documentation

## 2020-12-10 DIAGNOSIS — M545 Low back pain, unspecified: Secondary | ICD-10-CM | POA: Insufficient documentation

## 2020-12-10 MED ORDER — KETOROLAC TROMETHAMINE 15 MG/ML IJ SOLN
15.0000 mg | Freq: Once | INTRAMUSCULAR | Status: AC
  Administered 2020-12-10: 15 mg via INTRAMUSCULAR
  Filled 2020-12-10: qty 1

## 2020-12-10 MED ORDER — ONDANSETRON HCL 4 MG PO TABS: 4.0000 mg | ORAL_TABLET | Freq: Four times a day (QID) | ORAL | 0 refills | Status: AC

## 2020-12-10 MED ORDER — ACETAMINOPHEN 500 MG PO TABS
1000.0000 mg | ORAL_TABLET | Freq: Once | ORAL | Status: AC
  Administered 2020-12-10: 1000 mg via ORAL
  Filled 2020-12-10: qty 2

## 2020-12-10 NOTE — Discharge Instructions (Addendum)
Return for any problem - especially increased headache, nausea, vomiting, fever.  Take Tylenol for pain.   Use zofran as prescribed for nausea.

## 2020-12-10 NOTE — ED Triage Notes (Signed)
Patient states he slipped on wet steps 2 days ago. Patient states he hit his head and has back pain. Patient has abrasions to his lower back. Patient states he has nausea, dizziness, and a headache. Patient denies vomiting.

## 2020-12-10 NOTE — ED Provider Notes (Signed)
[facility]  HOSPITAL-EMERGENCY DEPT Provider Note   CSN: [id number] Arrival date & time: 12/10/20  0825     History Chief Complaint  Patient presents with   Fall   Head Injury   Back Pain    Benjamin Chandler is a 40 y.o. male.  40 year old male with prior medical history as detailed below presents for evaluation.  Patient reports that he slipped on wet steps 2 days prior.  He fell backwards.  He did strike his head.  He reports brief period of LOC.  He denies associated nausea or vomiting.  He complains of persistent pain to the low back.  He is able to ambulate.  He complains of mild diffuse headache.  He denies nausea or vomiting.  He denies fever.  He denies other extremity injury.  The history is provided by the patient.  Fall This is a new problem. The current episode started 2 days ago. The problem occurs rarely. The problem has not changed since onset.Pertinent negatives include no chest pain and no abdominal pain. Nothing aggravates the symptoms. Nothing relieves the symptoms.  Head Injury Location:  Occipital Time since incident:  2 days Mechanism of injury: fall   Fall:    Fall occurred:  Down stairs   Point of impact:  Back Pain details:    Quality:  Aching   Severity:  Mild   Duration:  2 days   Timing:  Constant   Progression:  Unchanged Chronicity:  New Back Pain Location:  Lumbar spine Quality:  Aching Pain severity:  Mild Onset quality:  Gradual Duration:  2 days Timing:  Constant Associated symptoms: no abdominal pain and no chest pain       History reviewed. No pertinent past medical history.  Patient Active Problem List   Diagnosis Date Noted   Facial abscess 07/25/2015   Facial cellulitis 07/25/2015   Dental abscess 07/25/2015    History reviewed. No pertinent surgical history.     Family History  Problem Relation Age of Onset   Cancer Father     Social History   Tobacco Use   Smoking status: Every Day    Packs/day:  0.50    Years: 5.00    Pack years: 2.50    Types: Cigarettes   Smokeless tobacco: Never   Tobacco comments:    Quit 08/2017  Vaping Use   Vaping Use: Former  Substance Use Topics   Alcohol use: Not Currently    Comment: social   Drug use: Not Currently    Types: Cocaine    Comment: former user    Home Medications Prior to Admission medications   Medication Sig Start Date End Date Taking? Authorizing Provider  aspirin 81 MG tablet Take 81 mg by mouth daily.    [provider]  doxycycline (VIBRAMYCIN) 100 MG capsule Take 1 capsule (100 mg total) by mouth 2 (two) times daily. 11/17/19   Maxwell Caul, PA-C  HYDROcodone-acetaminophen (NORCO/VICODIN) 5-325 MG tablet Take 1-2 tablets by mouth every 6 (six) hours as needed. 11/17/19   Maxwell Caul, PA-C  hydrocortisone (ANUSOL-HC) 2.5 % rectal cream Place 1 application rectally 2 (two) times daily. 04/30/19   Walisiewicz, Yvonna Alanis E, PA-C  polyethylene glycol (MIRALAX / GLYCOLAX) 17 g packet Take 17 g by mouth daily. 04/30/19   Shanon Ace, PA-C    Allergies    Patient has no known allergies.  Review of Systems   Review of Systems  Cardiovascular:  Negative for chest pain.  Gastrointestinal:  Negative for abdominal pain.  Musculoskeletal:  Positive for back pain.  All other systems reviewed and are negative.  Physical Exam Updated Vital Signs BP (!) 145/97 (BP Location: Left Arm)   Pulse (!) 118   Temp 99.9 F (37.7 C) (Oral)   Resp 18   Ht 6\' 2"  (1.88 m)   Wt 91.6 kg   SpO2 98%   BMI 25.94 kg/m   Physical Exam Vitals and nursing note reviewed.  Constitutional:      General: He is not in acute distress.    Appearance: Normal appearance. He is well-developed.  HENT:     Head: Normocephalic and atraumatic.  Eyes:     Conjunctiva/sclera: Conjunctivae normal.     Pupils: Pupils are equal, round, and reactive to light.  Cardiovascular:     Rate and Rhythm: Normal rate and regular rhythm.      Heart sounds: Normal heart sounds.  Pulmonary:     Effort: Pulmonary effort is normal. No respiratory distress.     Breath sounds: Normal breath sounds.  Abdominal:     General: There is no distension.     Palpations: Abdomen is soft.     Tenderness: There is no abdominal tenderness.  Musculoskeletal:        General: No deformity. Normal range of motion.     Cervical back: Normal range of motion and neck supple.  Skin:    General: Skin is warm and dry.  Neurological:     General: No focal deficit present.     Mental Status: He is alert and oriented to person, place, and time.     Cranial Nerves: No cranial nerve deficit.     Sensory: No sensory deficit.     Gait: Gait normal.     Comments: GCS 15    ED Results / Procedures / Treatments   Labs (all labs ordered are listed, but only abnormal results are displayed) Labs Reviewed - No data to display  EKG None  Radiology No results found.  Procedures Procedures   Medications Ordered in ED Medications  ketorolac (TORADOL) 15 MG/ML injection 15 mg (has no administration in time range)  acetaminophen (TYLENOL) tablet 1,000 mg (has no administration in time range)    ED Course  I have reviewed the triage vital signs and the nursing notes.  Pertinent labs & imaging results that were available during my care of the patient were reviewed by me and considered in my medical decision making (see chart for details).    MDM Rules/Calculators/A&P                           MDM  MSE complete  Benjamin Chandler was evaluated in Emergency Department on 12/10/2020 for the symptoms described in the history of present illness. He was evaluated in the context of the global COVID-19 pandemic, which necessitated consideration that the patient might be at risk for infection with the SARS-CoV-2 virus that causes COVID-19. Institutional protocols and algorithms that pertain to the evaluation of patients at risk for COVID-19 are in a state of  rapid change based on information released by regulatory bodies including the CDC and federal and state organizations. These policies and algorithms were followed during the patient's care in the ED.  Patient is presenting for evaluation following reported fall that occurred 2 days prior.  Patient did strike his head.  He also landed on his back.  He is primary complaint  today is of low back pain.  He also reports mild headache.  CT imaging is suggestive of very small possible subdural hematoma.  Case and imaging reviewed with Dr. Yetta Barre of neurosurgery.  He does not feel the patient requires inpatient treatment at this time.  Follow-up in the outpatient setting is appropriate.  Patient's other imaging does not reveal evidence of acute pathology.  Patient given strict head injury precautions.  Patient does understand need for close follow-up in the outpatient setting with Dr. Yetta Barre.      Final Clinical Impression(s) / ED Diagnoses Final diagnoses:  Injury of head, initial encounter    Rx / DC Orders ED Discharge Orders     None        Wynetta Fines, MD 12/10/20 1333

## 2020-12-10 NOTE — Progress Notes (Signed)
We were called to look at the [facility] on this gentleman who fell 2 days ago and has some headache. CT reviewed and he likely has minimal subdural blood along the tent on the left. This requires no intervention and he is now past his observation window. Would treat like a concussion and give instructions, may f/u with [place] or his PCP in 1-2 wks.

## 2021-02-11 ENCOUNTER — Emergency Department (HOSPITAL_COMMUNITY)
Admission: EM | Admit: 2021-02-11 | Discharge: 2021-02-11 | Discharge status: Against Medical Advice | Payer: Self-pay | Source: Home / Self Care

## 2021-02-12 ENCOUNTER — Emergency Department (HOSPITAL_COMMUNITY)
Admission: EM | Admit: 2021-02-12 | Discharge: 2021-02-12 | Disposition: A | Discharge status: Home / Self Care | Payer: Self-pay | Attending: Emergency Medicine | Admitting: Emergency Medicine

## 2021-02-12 ENCOUNTER — Emergency Department (HOSPITAL_COMMUNITY): Payer: Self-pay

## 2021-02-12 DIAGNOSIS — R000 Tachycardia, unspecified: Secondary | ICD-10-CM | POA: Insufficient documentation

## 2021-02-12 DIAGNOSIS — W1839XA Other fall on same level, initial encounter: Secondary | ICD-10-CM | POA: Insufficient documentation

## 2021-02-12 DIAGNOSIS — Z7982 Long term (current) use of aspirin: Secondary | ICD-10-CM | POA: Insufficient documentation

## 2021-02-12 DIAGNOSIS — F17210 Nicotine dependence, cigarettes, uncomplicated: Secondary | ICD-10-CM | POA: Insufficient documentation

## 2021-02-12 DIAGNOSIS — R0602 Shortness of breath: Secondary | ICD-10-CM | POA: Insufficient documentation

## 2021-02-12 DIAGNOSIS — R079 Chest pain, unspecified: Secondary | ICD-10-CM

## 2021-02-12 DIAGNOSIS — R0789 Other chest pain: Secondary | ICD-10-CM | POA: Insufficient documentation

## 2021-02-12 DIAGNOSIS — S4992XA Unspecified injury of left shoulder and upper arm, initial encounter: Secondary | ICD-10-CM | POA: Insufficient documentation

## 2021-02-12 DIAGNOSIS — S46912A Strain of unspecified muscle, fascia and tendon at shoulder and upper arm level, left arm, initial encounter: Secondary | ICD-10-CM

## 2021-02-12 LAB — CBC WITH DIFFERENTIAL/PLATELET
Abs Immature Granulocytes: 0.02 10*3/uL (ref 0.00–0.07)
Basophils Absolute: 0.1 10*3/uL (ref 0.0–0.1)
Basophils Relative: 1 %
Eosinophils Absolute: 0.1 10*3/uL (ref 0.0–0.5)
Eosinophils Relative: 2 %
HCT: 45 % (ref 39.0–52.0)
Hemoglobin: 15.3 g/dL (ref 13.0–17.0)
Immature Granulocytes: 0 %
Lymphocytes Relative: 39 %
Lymphs Abs: 2.5 10*3/uL (ref 0.7–4.0)
MCH: 31 pg (ref 26.0–34.0)
MCHC: 34 g/dL (ref 30.0–36.0)
MCV: 91.1 fL (ref 80.0–100.0)
Monocytes Absolute: 0.7 10*3/uL (ref 0.1–1.0)
Monocytes Relative: 10 %
Neutro Abs: 3 10*3/uL (ref 1.7–7.7)
Neutrophils Relative %: 48 %
Platelets: 277 10*3/uL (ref 150–400)
RBC: 4.94 MIL/uL (ref 4.22–5.81)
RDW: 12.5 % (ref 11.5–15.5)
WBC: 6.3 10*3/uL (ref 4.0–10.5)
nRBC: 0 % (ref 0.0–0.2)

## 2021-02-12 LAB — COMPREHENSIVE METABOLIC PANEL
ALT: 57 U/L — ABNORMAL HIGH (ref 0–44)
AST: 42 U/L — ABNORMAL HIGH (ref 15–41)
Albumin: 4.3 g/dL (ref 3.5–5.0)
Alkaline Phosphatase: 59 U/L (ref 38–126)
Anion gap: 14 (ref 5–15)
BUN: 10 mg/dL (ref 6–20)
CO2: 23 mmol/L (ref 22–32)
Calcium: 9 mg/dL (ref 8.9–10.3)
Chloride: 101 mmol/L (ref 98–111)
Creatinine, Ser: 0.82 mg/dL (ref 0.61–1.24)
GFR, Estimated: >60 mL/min (ref >60)
Glucose, Bld: 93 mg/dL (ref 70–99)
Potassium: 3.3 mmol/L — ABNORMAL LOW (ref 3.5–5.1)
Sodium: 138 mmol/L (ref 135–145)
Total Bilirubin: 0.6 mg/dL (ref 0.3–1.2)
Total Protein: 7.5 g/dL (ref 6.5–8.1)

## 2021-02-12 LAB — TROPONIN I (HIGH SENSITIVITY)
Troponin I (High Sensitivity): 5 ng/L (ref <18)
Troponin I (High Sensitivity): 4 ng/L (ref <18)

## 2021-02-12 LAB — D-DIMER, QUANTITATIVE: D-Dimer, Quant: <0.27 ug/mL-FEU (ref 0.00–0.50)

## 2021-02-12 MED ORDER — ACETAMINOPHEN 500 MG PO TABS
1000.0000 mg | ORAL_TABLET | Freq: Once | ORAL | Status: AC
  Administered 2021-02-12: 1000 mg via ORAL
  Filled 2021-02-12: qty 2

## 2021-02-12 MED ORDER — LIDOCAINE 5 % EX PTCH: 1.0000 | MEDICATED_PATCH | CUTANEOUS | 0 refills | Status: AC

## 2021-02-12 MED ORDER — LIDOCAINE 5 % EX PTCH
1.0000 | MEDICATED_PATCH | CUTANEOUS | Status: DC
  Administered 2021-02-12: 1 via TRANSDERMAL
  Filled 2021-02-12: qty 1

## 2021-02-12 NOTE — Discharge Instructions (Addendum)
Shoulder pain-follow-up with Dr. Clearnce Sorrel for orthopedic follow-up.  Take Tylenol ibuprofen as needed, he can also apply the Lidoderm patch as it does provide relief.  Chest pain work-up-your work-up today was reassuring, did not show any acute indications of heart attacks or blood clots.  I suspect the pain is related to muscle strain, please follow-up with a primary care doctor about it.  Return to the ED if chest pain worsens.

## 2021-02-12 NOTE — ED Provider Notes (Signed)
Emergency Medicine Provider Triage Evaluation Note  Benjamin Chandler , a 40 y.o. male  was evaluated in triage.  Pt complains of left shoulder pain.  He states that for 2 days he has had increasing pain in his left shoulder.  He denies any falls or injuries.  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  He does state that he has had associated left-sided chest pain and shortness of breath that started last night.  He denies any nausea or diaphoresis.  States that the chest pain has been intermittent, throbbing.  Denies fevers Review of Systems  Positive:  Negative:   Physical Exam  BP (!) 129/92 (BP Location: Right Arm)   Pulse (!) 101   Temp 98.8 F (37.1 C)   Resp 16   SpO2 96%  Gen:   Awake, no distress   Resp:  Normal effort clear to auscultation MSK:   The left shoulder is warm but I cannot appreciate increased warmth compared to the right shoulder, without redness or obvious swelling.  He has limited range of motion due to pain.  It is painful to touch.  Pulses 2+ bilateral radial. Other:  S1/S2 without murmur.  Tachycardia.  Medical Decision Making  Medically screening exam initiated at 7:56 AM.  Appropriate orders placed.  Damichael Hofman was informed that the remainder of the evaluation will be completed by another provider, this initial triage assessment does not replace that evaluation, and the importance of remaining in the ED until their evaluation is complete.     Cristopher Peru, PA-C 02/12/21 [id number]    Milagros Loll, MD 02/12/21 [phone]

## 2021-02-12 NOTE — ED Provider Notes (Signed)
[facility] EMERGENCY DEPARTMENT Provider Note   CSN: [id number] Arrival date & time: 02/12/21  [id number]     History Chief Complaint  Patient presents with   Shortness of Breath   Shoulder Injury   Shoulder Pain   Chest Pain    Benjamin Chandler is a 40 y.o. male.   Shortness of Breath Associated symptoms: chest pain   Associated symptoms: no abdominal pain, no cough, no ear pain, no fever, no rash, no sore throat, no vomiting and no wheezing   Shoulder Injury Associated symptoms include chest pain and shortness of breath. Pertinent negatives include no abdominal pain.  Shoulder Pain Associated symptoms: no back pain and no fever   Chest Pain Associated symptoms: shortness of breath   Associated symptoms: no abdominal pain, no back pain, no cough, no fever, no nausea, no palpitations and no vomiting    Patient without pertinent medical history presents with chest pain, shortness of breath, left shoulder pain.  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  It happened acutely, the pain was severe and has stayed relatively constant.  Pain is aggravated by movement, denies any fall or injury to the shoulder.  No previous surgeries on the shoulder.  No associated skin color changes.  Chest pain shortness of breath started last night.  They both are intermittent.  The chest pain feels like a dull aching pain on the left side of his chest with radiation to the left arm, he is not entirely sure if it is the shoulder pain or the left sided chest pain that is in the arm.  There is no associated nausea or vomiting.  It lasts for maybe 5 minutes and then resolves independently of intervention.  Does not take medicine for hypertension, hypercholesterolemia, diabetes.  No history of prior MI or blood clot, no recent surgeries or travel.  Patient is a daily cigarette smoker.  No past medical history on file.  Patient Active Problem List   Diagnosis Date  Noted   Facial abscess 07/25/2015   Facial cellulitis 07/25/2015   Dental abscess 07/25/2015    No past surgical history on file.     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 Authorizing Provider  aspirin 81 MG tablet Take 81 mg by mouth daily.    [provider]  doxycycline (VIBRAMYCIN) 100 MG capsule Take 1 capsule (100 mg total) by mouth 2 (two) times daily. 11/17/19   Volanda Napoleon, PA-C  HYDROcodone-acetaminophen (NORCO/VICODIN) 5-325 MG tablet Take 1-2 tablets by mouth every 6 (six) hours as needed. 11/17/19   Volanda Napoleon, PA-C  hydrocortisone (ANUSOL-HC) 2.5 % rectal cream Place 1 application rectally 2 (two) times daily. 04/30/19   Walisiewicz, Kaitlyn E, PA-C  ondansetron (ZOFRAN) 4 MG tablet Take 1 tablet (4 mg total)  by mouth every 6 (six) hours. 12/10/20   Wynetta Fines, MD  polyethylene glycol (MIRALAX / GLYCOLAX) 17 g packet Take 17 g by mouth daily. 04/30/19   Shanon Ace, PA-C    Allergies    Patient has no known allergies.  Review of Systems   Review of Systems  Constitutional:  Negative for chills and fever.  HENT:  Negative for ear pain and sore throat.   Eyes:  Negative for pain and visual disturbance.  Respiratory:  Positive for shortness of breath. Negative for cough and wheezing.   Cardiovascular:  Positive for chest pain. Negative for palpitations and leg swelling.  Gastrointestinal:  Negative for abdominal pain, nausea and  vomiting.  Genitourinary:  Negative for dysuria and hematuria.  Musculoskeletal:  Positive for arthralgias and myalgias. Negative for back pain.  Skin:  Negative for color change, rash and wound.  Neurological:  Negative for seizures and syncope.  All other systems reviewed and are negative.  Physical Exam Updated Vital Signs BP (!) 142/87 (BP Location: Right Arm)   Pulse 99   Temp 98.2 F (36.8 C) (Oral)   Resp 13   SpO2 95%   Physical Exam Vitals and nursing note reviewed.  Constitutional:      Appearance: He is well-developed.  HENT:     Head: Normocephalic and atraumatic.  Eyes:     Conjunctiva/sclera: Conjunctivae normal.  Cardiovascular:     Rate and Rhythm: Normal rate and regular rhythm.     Heart sounds: No murmur heard.    Comments: S1-S2 without any murmurs.  Radial pulse 2+ equal bilaterally Pulmonary:     Effort: Pulmonary effort is normal. No respiratory distress.     Breath sounds: Normal breath sounds.     Comments: Lungs are clear to auscultation bilaterally, no tachypnea or accessory muscle use Abdominal:     Palpations: Abdomen is soft.     Tenderness: There is no abdominal tenderness.  Musculoskeletal:        General: Tenderness present. No signs of injury.     Cervical back: Neck supple.     Comments: Decreased range of motion to the left shoulder, tenderness of the acromion process.  No overlying skin discoloration, tolerates passive range of motion with some difficulty, unable to perform active range of motion.  Skin:    General: Skin is warm and dry.     Capillary Refill: Capillary refill takes less than 2 seconds.  Neurological:     Mental Status: He is alert.   ED Results / Procedures / Treatments   Labs (all labs ordered are listed, but only abnormal results are displayed) Labs Reviewed  COMPREHENSIVE METABOLIC PANEL - Abnormal; Notable for the following components:      Result Value   Potassium 3.3 (*)    AST 42 (*)    ALT 57 (*)     All other components within normal limits  CBC WITH DIFFERENTIAL/PLATELET  TROPONIN I (HIGH SENSITIVITY)  TROPONIN I (HIGH SENSITIVITY)    EKG None  Radiology DG Chest 2 View  Result Date: 02/12/2021 CLINICAL DATA:  Chest pain after fall 2 days ago. EXAM: CHEST - 2 VIEW COMPARISON:  Aug 24, 2016. FINDINGS: The heart size and mediastinal contours are within normal limits. Both lungs are clear. The visualized skeletal structures are unremarkable. IMPRESSION: No active cardiopulmonary disease. Electronically Signed   By: Lupita Raider M.D.   On: 02/12/2021 08:33   DG Shoulder Left  Result Date: 02/12/2021  CLINICAL DATA:  Left shoulder pain after fall 2 days ago. EXAM: LEFT SHOULDER - 2+ VIEW COMPARISON:  None. FINDINGS: There is no evidence of fracture or dislocation. There is no evidence of arthropathy or other focal bone abnormality. Soft tissues are unremarkable. IMPRESSION: Negative. Electronically Signed   By: Marijo Conception M.D.   On: 02/12/2021 08:31    Procedures Procedures   Medications Ordered in ED Medications  acetaminophen (TYLENOL) tablet 1,000 mg (has no administration in time range)  lidocaine (LIDODERM) 5 % 1 patch (has no administration in time range)    ED Course  I have reviewed the triage vital signs and the nursing notes.  Pertinent labs & imaging results that were available during my care of the patient were reviewed by me and considered in my medical decision making (see chart for details).    MDM Rules/Calculators/A&P                           Stable vitals, nontoxic-appearing.  Patient has had a slight tachycardia ranging between 100-105, low risk factors but will check a dimer given chest pain and shortness of breath.  EKG showed sinus tach without any ST elevations concerning for ACS.  Negative delta troponin, no gross electrolyte derangement.  Radiograph negative for any signs of cardiomegaly suggestive of heart failure or pneumonia as the cause  of his chest pain.  CBC without any findings of leukocytosis that would be supportive of an infectious process.  Doubt any emergent cardiac pathology based on work-up and exam.  Although patient is neurovascularly intact, he does have significant shoulder pain.  He is able to tolerate passive range of motion, do not suspect septic joint.  Radiograph is negative for any acute fracture or dislocation.  Good pulses, compartment is soft.  Suspect possible muscle sprain or strain.  We will have patient follow-up with orthopedic outpatient.  Not suspect any emergent disease requiring additional emergent work-up at this time.  Patient discharged in stable condition.  Final Clinical Impression(s) / ED Diagnoses Final diagnoses:  None    Rx / DC Orders ED Discharge Orders     None        Sherrill Raring, Hershal Coria 02/12/21 1251    Valarie Merino, MD 02/16/21 [phone]

## 2021-02-12 NOTE — ED Triage Notes (Signed)
Pt. Stated, I injured my left shoulder some how about 2 days ago. 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

## 2022-03-21 IMAGING — CR DG LUMBAR SPINE 2-3V
3 series · 3 of 3 positions shown · non-contrast
Comparison: None.

CLINICAL DATA: Fall 2 days ago, back pain

EXAM:
LUMBAR SPINE - 2-3 VIEW

[t lumbar spine ap]
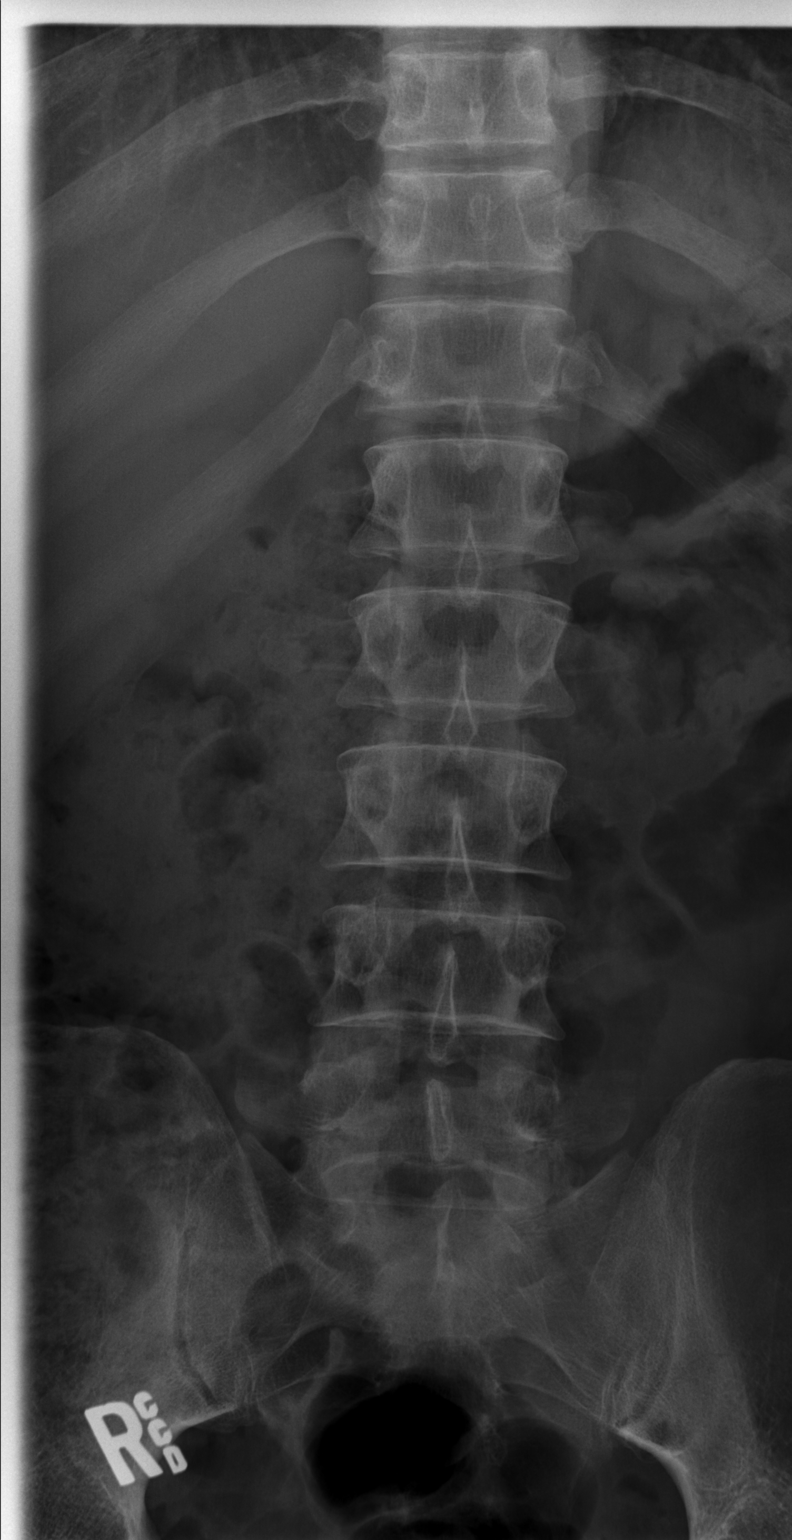

[t lumbar spine lat]
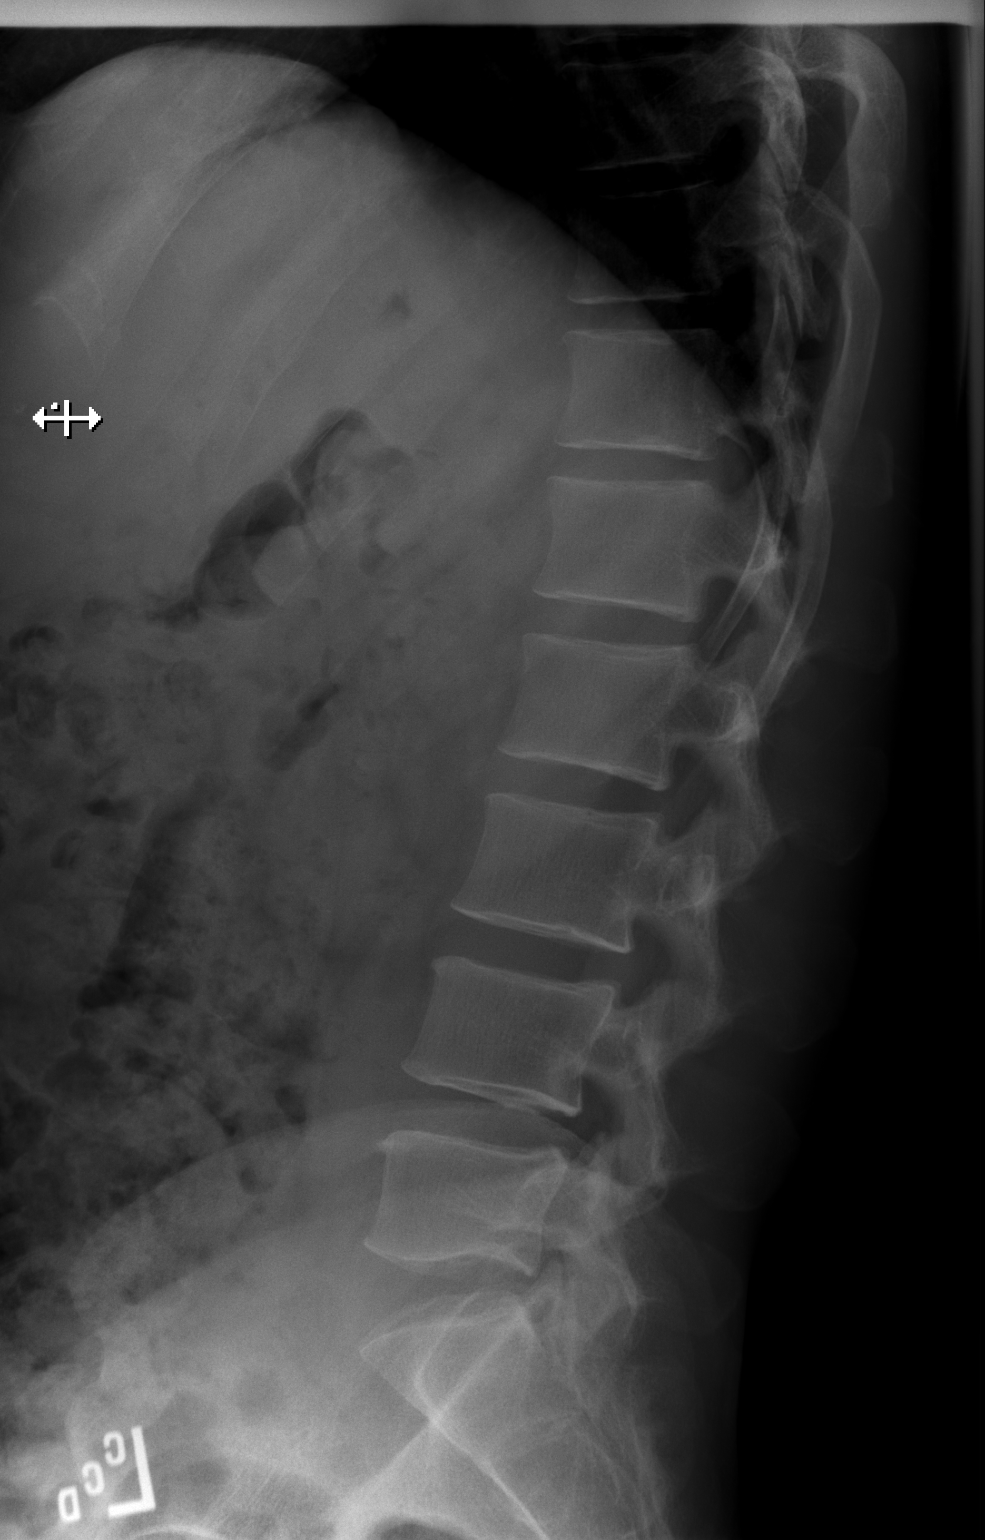

[t lumbar l-5 s-1 spot]
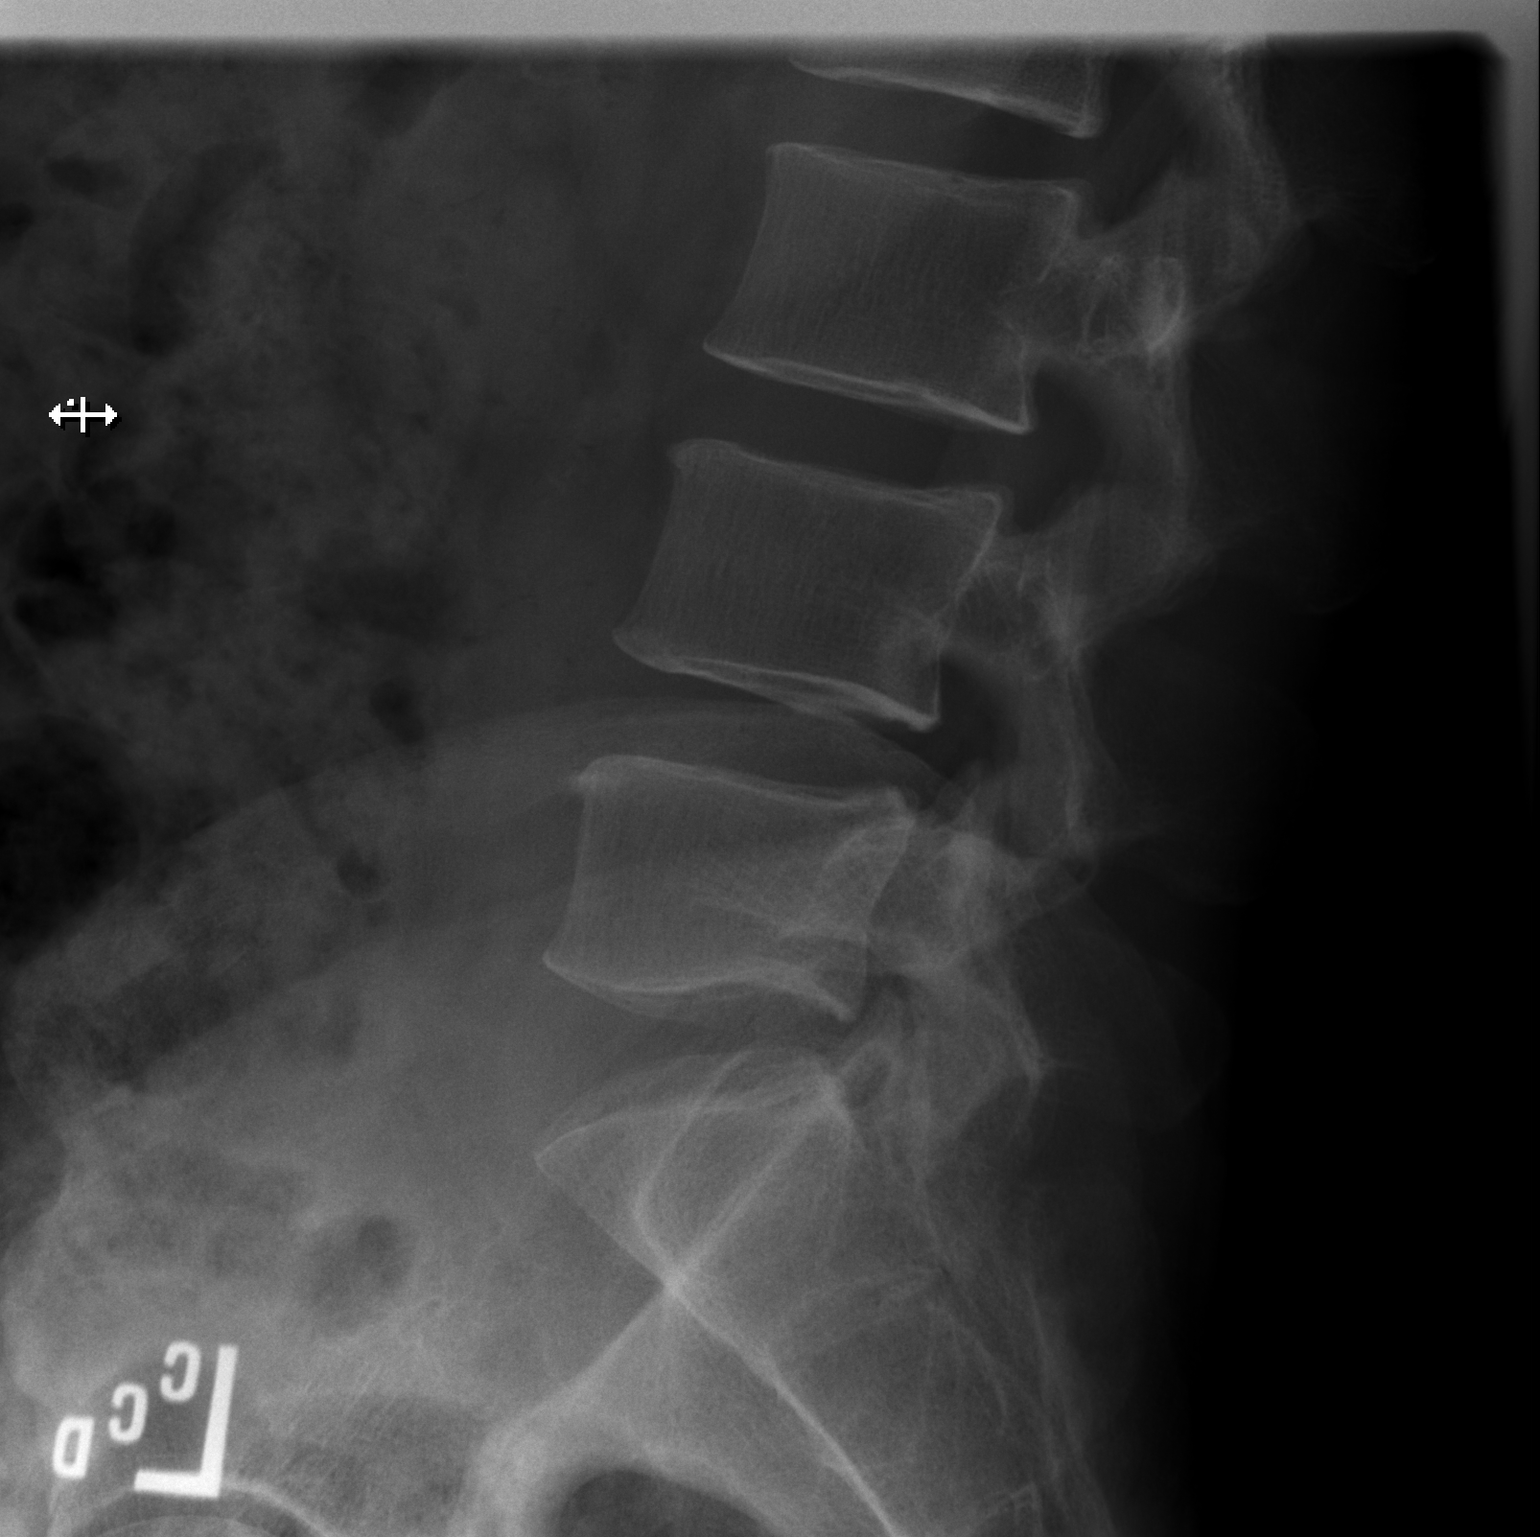

[3 of 3 positions shown; findings below may reference images not displayed]

FINDINGS: There is no evidence of lumbar spine fracture. Alignment is normal.
Intervertebral disc spaces are maintained.
IMPRESSION: No fracture or dislocation of the lumbar spine. Disc spaces and
vertebral body heights are preserved.

## 2022-05-24 IMAGING — DX DG CHEST 2V
3 series · 3 of 3 positions shown · non-contrast
Comparison: August 24, 2016.

CLINICAL DATA: Chest pain after fall 2 days ago.

EXAM:
CHEST - 2 VIEW

[chest pa]
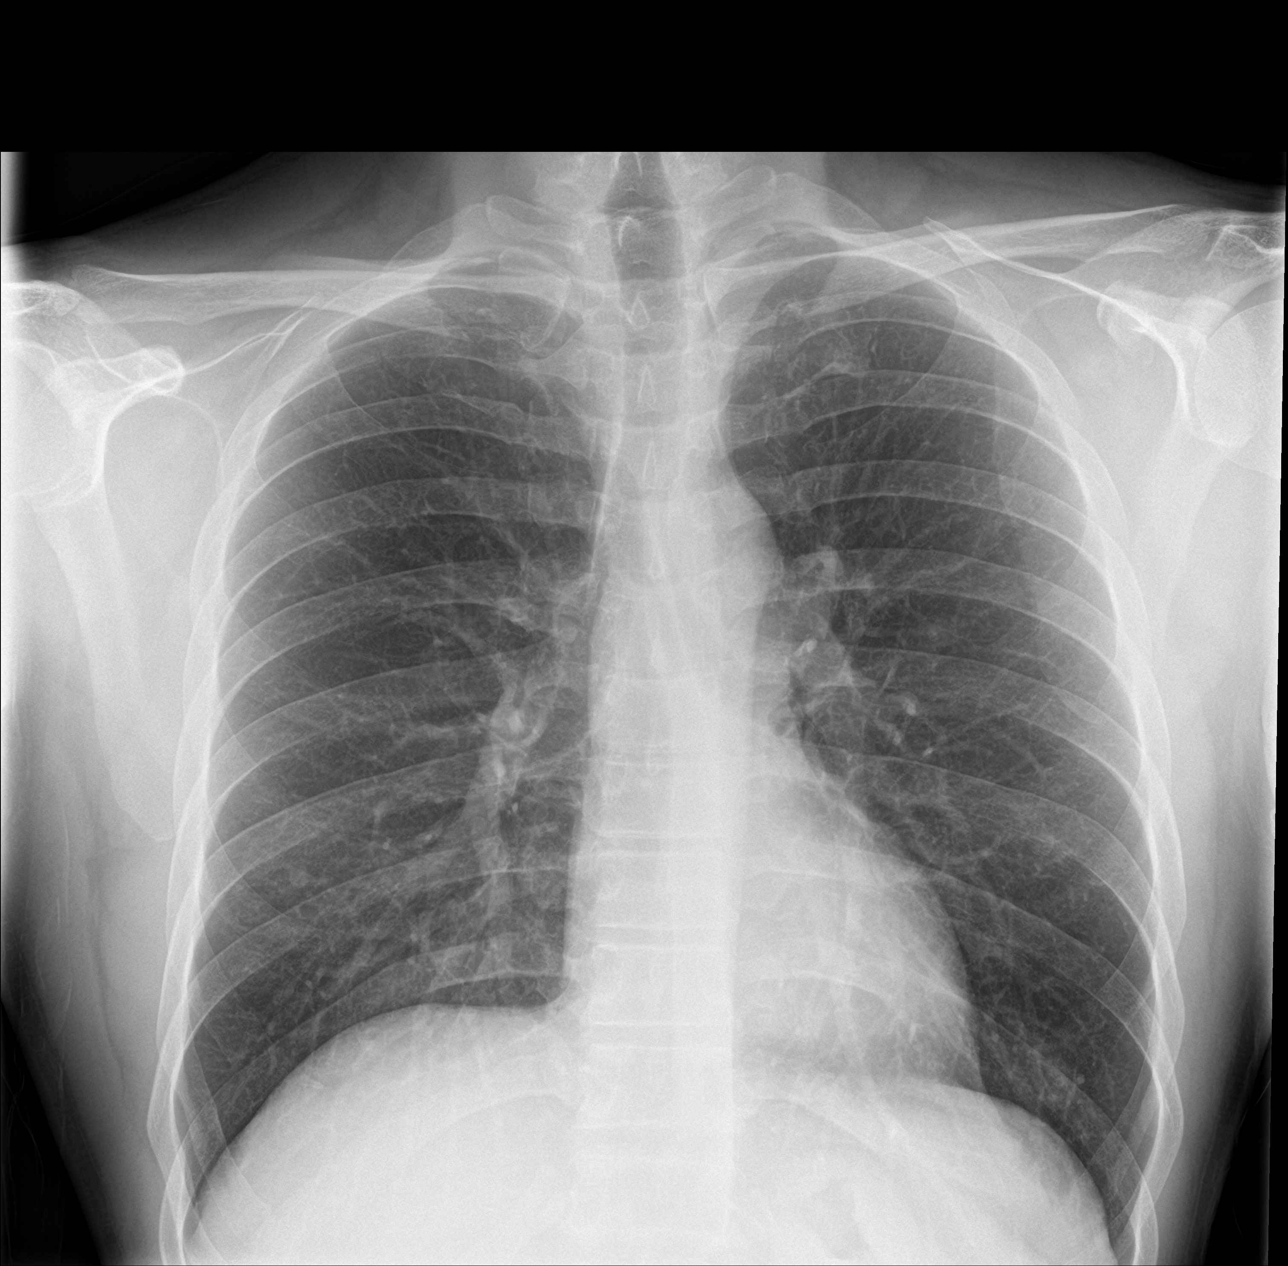

[chest lat (1 of 2)]
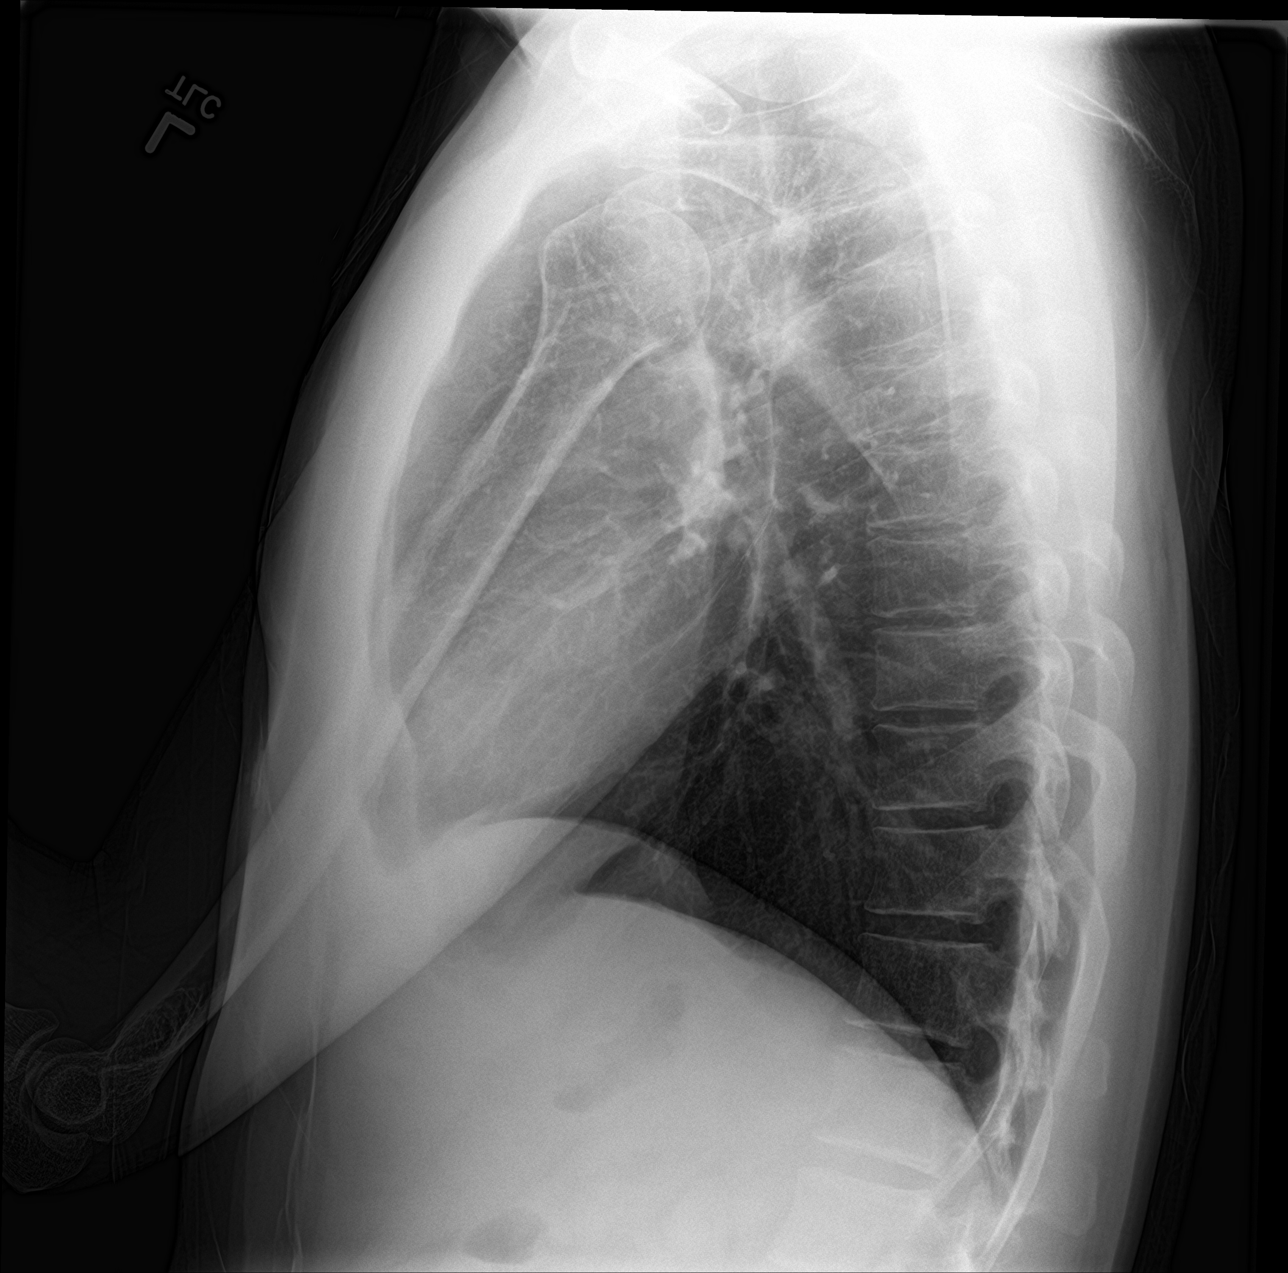

[chest lat (2 of 2)]
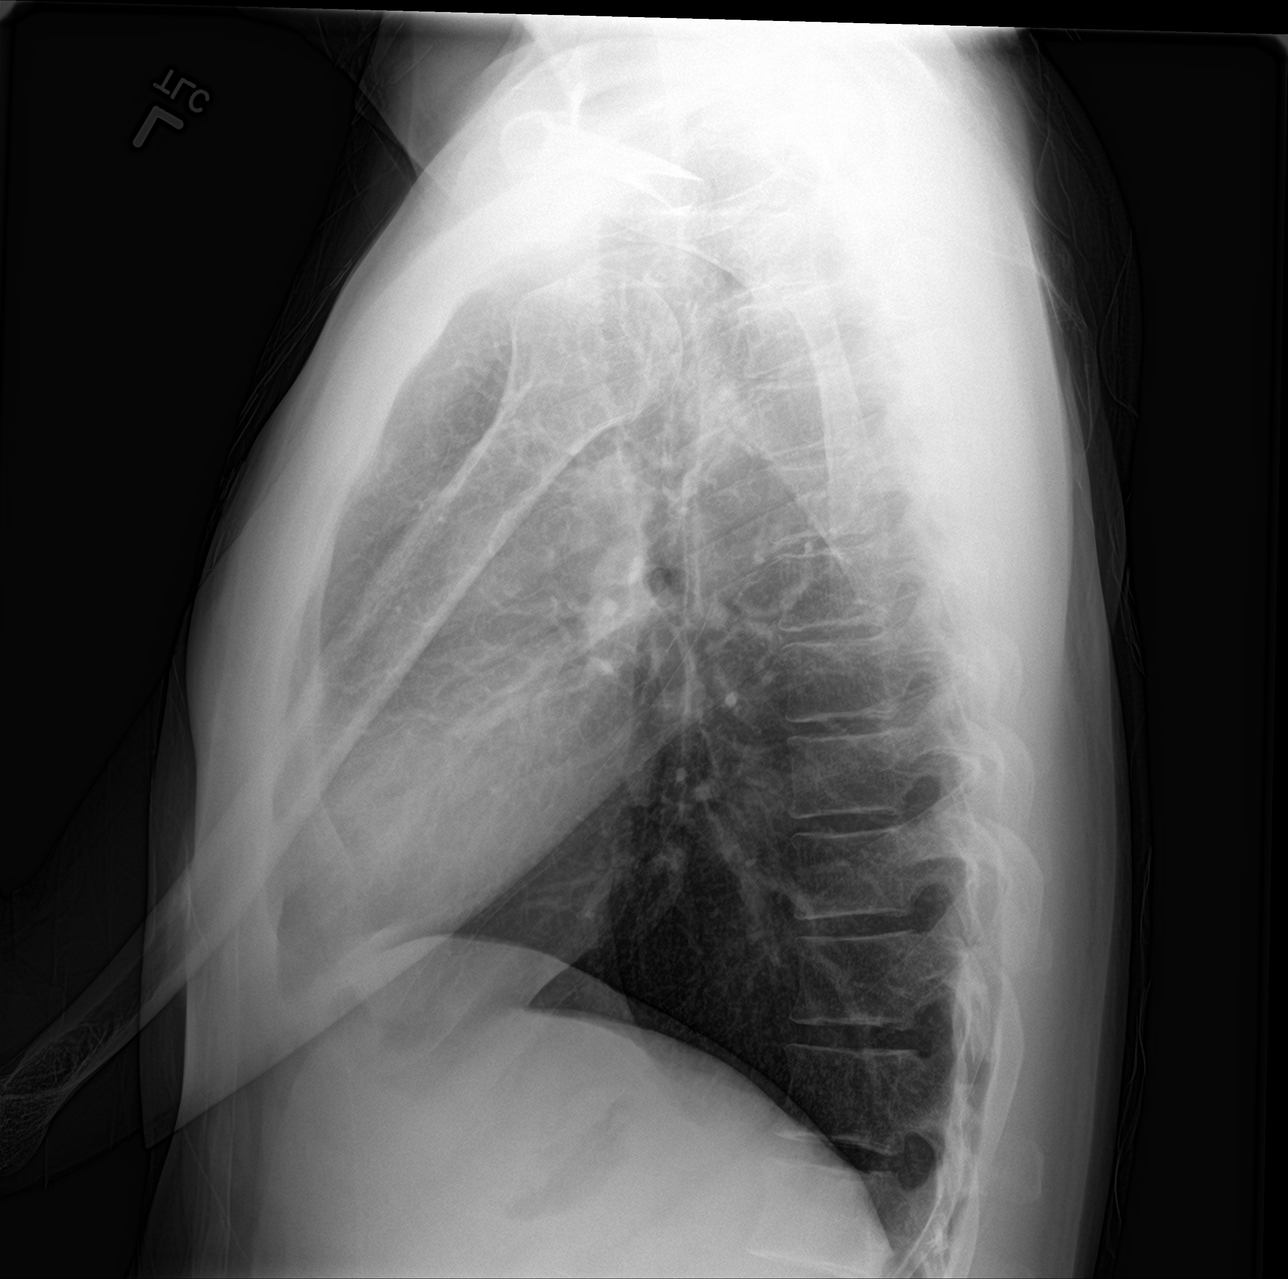

[3 of 3 positions shown; findings below may reference images not displayed]

FINDINGS: The heart size and mediastinal contours are within normal limits.
Both lungs are clear. The visualized skeletal structures are
unremarkable.
IMPRESSION: No active cardiopulmonary disease.
